# Patient Record
Sex: Female | Born: 1985 | Race: Black or African American | Hispanic: No | Marital: Single | Smoking: Former smoker
Health system: Southern US, Community
[De-identification: ages and names within clinical notes are randomized; demographics above are authoritative.]

## PROBLEM LIST (undated history)

## (undated) DIAGNOSIS — Z8669 Personal history of other diseases of the nervous system and sense organs: Secondary | ICD-10-CM

## (undated) DIAGNOSIS — M545 Low back pain, unspecified: Secondary | ICD-10-CM

## (undated) DIAGNOSIS — E079 Disorder of thyroid, unspecified: Secondary | ICD-10-CM

## (undated) DIAGNOSIS — J309 Allergic rhinitis, unspecified: Secondary | ICD-10-CM

## (undated) HISTORY — DX: Low back pain, unspecified: M54.50

## (undated) HISTORY — PX: HERNIA REPAIR: SHX51

## (undated) HISTORY — DX: Personal history of other diseases of the nervous system and sense organs: Z86.69

## (undated) HISTORY — DX: Allergic rhinitis, unspecified: J30.9

## (undated) HISTORY — DX: Low back pain: M54.5

---
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
## 2008-12-18 ENCOUNTER — Emergency Department (HOSPITAL_COMMUNITY): Admission: EM | Admit: 2008-12-18 | Discharge: 2008-12-18 | Payer: Self-pay | Admitting: Family Medicine

## 2010-01-23 ENCOUNTER — Emergency Department (HOSPITAL_COMMUNITY): Admission: EM | Admit: 2010-01-23 | Discharge: 2010-01-23 | Payer: Self-pay | Admitting: Emergency Medicine

## 2010-03-30 ENCOUNTER — Inpatient Hospital Stay (HOSPITAL_COMMUNITY)
Admission: AD | Admit: 2010-03-30 | Discharge: 2010-03-30 | Payer: Self-pay | Source: Home / Self Care | Attending: Obstetrics & Gynecology | Admitting: Obstetrics & Gynecology

## 2010-06-10 LAB — URINALYSIS, ROUTINE W REFLEX MICROSCOPIC
Bilirubin Urine: NEGATIVE
Glucose, UA: NEGATIVE mg/dL
Ketones, ur: NEGATIVE mg/dL
Nitrite: NEGATIVE
Specific Gravity, Urine: 1.02 (ref 1.005–1.030)

## 2010-06-10 LAB — WET PREP, GENITAL

## 2010-07-05 LAB — POCT PREGNANCY, URINE: Preg Test, Ur: NEGATIVE

## 2011-03-27 ENCOUNTER — Emergency Department (INDEPENDENT_AMBULATORY_CARE_PROVIDER_SITE_OTHER)
Admission: EM | Admit: 2011-03-27 | Discharge: 2011-03-27 | Disposition: A | Discharge status: Home / Self Care | Payer: BC Managed Care – PPO | Source: Home / Self Care | Attending: Emergency Medicine | Admitting: Emergency Medicine

## 2011-03-27 ENCOUNTER — Encounter: Payer: Self-pay | Admitting: Emergency Medicine

## 2011-03-27 DIAGNOSIS — J40 Bronchitis, not specified as acute or chronic: Secondary | ICD-10-CM

## 2011-03-27 MED ORDER — GUAIFENESIN-CODEINE 100-10 MG/5ML PO SYRP: 10.0000 mL | ORAL_SOLUTION | Freq: Four times a day (QID) | ORAL | Status: AC | PRN

## 2011-03-27 MED ORDER — DOXYCYCLINE HYCLATE 100 MG PO TABS: 100.0000 mg | ORAL_TABLET | Freq: Two times a day (BID) | ORAL | Status: AC

## 2011-03-27 NOTE — ED Provider Notes (Signed)
History     CSN: [id number]  Arrival date & time 03/27/11  1911   First MD Initiated Contact with Patient 03/27/11 1913      Chief Complaint  Patient presents with  . Sore Throat    (Consider location/radiation/quality/duration/timing/severity/associated sxs/prior treatment) HPI Comments: Mariah Rocha is a 25 year old female who has had a four-day history of myalgias, headache, sore throat, nasal congestion with a rhinorrhea and cough productive of yellow sputum. Her mother had the same symptoms. She has not gotten the flu vaccine this year. She denies fever, chills, nausea, vomiting, or diarrhea.  Patient is a 24 y.o. female presenting with pharyngitis.  Sore Throat Associated symptoms include headaches. Pertinent negatives include no abdominal pain and no shortness of breath.    History reviewed. No pertinent past medical history.  History reviewed. No pertinent past surgical history.  No family history on file.  History  Substance Use Topics  . Smoking status: Never Smoker   . Smokeless tobacco: Not on file  . Alcohol Use: No    OB History    Grav Para Term Preterm Abortions TAB SAB Ect Mult Living                  Review of Systems  Constitutional: Negative for fever, chills and fatigue.  HENT: Positive for congestion, sore throat and rhinorrhea. Negative for ear pain, sneezing, neck stiffness, voice change and postnasal drip.   Eyes: Negative for pain, discharge and redness.  Respiratory: Positive for cough. Negative for chest tightness, shortness of breath and wheezing.   Gastrointestinal: Negative for nausea, vomiting, abdominal pain and diarrhea.  Musculoskeletal: Positive for myalgias.  Skin: Negative for rash.  Neurological: Positive for headaches.    Allergies  Review of patient's allergies indicates no known allergies.  Home Medications   Current Outpatient Rx  Name Route Sig Dispense Refill  . SEASONIQUE PO Oral Take by mouth.      . DOXYCYCLINE  HYCLATE 100 MG PO TABS Oral Take 1 tablet (100 mg total) by mouth 2 (two) times daily. 20 tablet 0  . GUAIFENESIN-CODEINE 100-10 MG/5ML PO SYRP Oral Take 10 mLs by mouth 4 (four) times daily as needed for cough. 120 mL 0    BP 133/78  Pulse 81  Temp(Src) 98.5 F (36.9 C) (Oral)  Resp 16  SpO2 100%  LMP 01/30/2011  Physical Exam  Nursing note and vitals reviewed. Constitutional: She appears well-developed and well-nourished. No distress.  HENT:  Head: Normocephalic and atraumatic.  Right Ear: External ear normal.  Left Ear: External ear normal.  Nose: Nose normal.  Mouth/Throat: Oropharynx is clear and moist. No oropharyngeal exudate.  Eyes: Conjunctivae and EOM are normal. Pupils are equal, round, and reactive to light. Right eye exhibits no discharge. Left eye exhibits no discharge.  Neck: Normal range of motion. Neck supple.  Cardiovascular: Normal rate, regular rhythm and normal heart sounds.   Pulmonary/Chest: Effort normal and breath sounds normal. No stridor. No respiratory distress. She has no wheezes. She has no rales. She exhibits no tenderness.  Lymphadenopathy:    She has no cervical adenopathy.  Skin: Skin is warm and dry. No rash noted. She is not diaphoretic.    ED Course  Procedures (including critical care time)  Labs Reviewed - No data to display No results found.   1. Bronchitis       MDM  She has bronchitis. Will treat with doxycycline and guaifenesin/codeine cough syrup.        Onalee Hua  Matthew Folks, MD 03/27/11 2120

## 2011-03-27 NOTE — ED Notes (Signed)
Triage assess. Lengthened due to two patient's in same room

## 2011-03-27 NOTE — ED Notes (Signed)
Sore throat cough, body aches, headache

## 2011-04-14 ENCOUNTER — Emergency Department (INDEPENDENT_AMBULATORY_CARE_PROVIDER_SITE_OTHER)
Admission: EM | Admit: 2011-04-14 | Discharge: 2011-04-14 | Disposition: A | Discharge status: Home / Self Care | Payer: BC Managed Care – PPO | Source: Home / Self Care | Attending: Emergency Medicine | Admitting: Emergency Medicine

## 2011-04-14 ENCOUNTER — Encounter (HOSPITAL_COMMUNITY): Payer: Self-pay

## 2011-04-14 DIAGNOSIS — H109 Unspecified conjunctivitis: Secondary | ICD-10-CM

## 2011-04-14 MED ORDER — TOBRAMYCIN 0.3 % OP SOLN: 1.0000 [drp] | OPHTHALMIC | Status: AC

## 2011-04-14 NOTE — ED Provider Notes (Signed)
History     CSN: [id number]  Arrival date & time 04/14/11  1552   First MD Initiated Contact with Patient 04/14/11 1702      Chief Complaint  Patient presents with  . Eye Problem    (Consider location/radiation/quality/duration/timing/severity/associated sxs/prior treatment) HPI Comments: WOKE UP THIS MORNING WITH  "CRUSTED DISCHARGE AND REDNESS OF MY LEFT EYE" "IT FEELS DIFFERENT, BUT NO PAIN" "NO TRAUMA"   Patient is a 26 y.o. female presenting with eye problem. The history is provided by the patient.  Eye Problem  This is a new problem. The current episode started yesterday. The problem occurs constantly. There is pain in the left eye. The pain is at a severity of 2/10. The pain is mild. There is no history of trauma to the eye. There is no known exposure to pink eye. She does not wear contacts. Associated symptoms include discharge and eye redness. Pertinent negatives include no numbness, no blurred vision, no decreased vision, no foreign body sensation, no photophobia, no nausea, no vomiting, no tingling, no weakness and no itching. She has tried nothing for the symptoms.    History reviewed. No pertinent past medical history.  History reviewed. No pertinent past surgical history.  History reviewed. No pertinent family history.  History  Substance Use Topics  . Smoking status: Never Smoker   . Smokeless tobacco: Not on file  . Alcohol Use: No    OB History    Grav Para Term Preterm Abortions TAB SAB Ect Mult Living                  Review of Systems  Constitutional: Negative for fever, fatigue and unexpected weight change.  HENT: Negative for ear pain, facial swelling, neck pain and neck stiffness.   Eyes: Positive for discharge and redness. Negative for blurred vision and photophobia.  Respiratory: Negative for wheezing.   Gastrointestinal: Negative for nausea and vomiting.  Skin: Negative for itching and rash.  Neurological: Negative for dizziness, tingling,  seizures, weakness, numbness and headaches.    Allergies  Review of patient's allergies indicates no known allergies.  Home Medications   Current Outpatient Rx  Name Route Sig Dispense Refill  . SEASONIQUE PO Oral Take by mouth.      . TOBRAMYCIN SULFATE 0.3 % OP SOLN Left Eye Place 1 drop into the left eye every 4 (four) hours. 5 mL 0    BP 118/55  Pulse 84  Temp(Src) 99.2 F (37.3 C) (Oral)  Resp 16  SpO2 99%  LMP 01/30/2011  Physical Exam  Nursing note and vitals reviewed. Constitutional: She appears well-developed. No distress.  HENT:  Head: Normocephalic.  Mouth/Throat: No oropharyngeal exudate.  Eyes: EOM are normal. Pupils are equal, round, and reactive to light. Right eye exhibits discharge. Right eye exhibits no exudate. Left conjunctiva is injected. No scleral icterus. Right pupil is reactive. Left pupil is reactive.  Neck: Normal range of motion.  Cardiovascular: Normal rate.   No murmur heard. Pulmonary/Chest: Effort normal. No respiratory distress. She has no decreased breath sounds. She has no wheezes.  Abdominal: Soft. There is no tenderness.  Lymphadenopathy:    She has no cervical adenopathy.  Skin: No erythema.    ED Course  Procedures (including critical care time)  Labs Reviewed - No data to display No results found.   1. Conjunctivitis       MDM  Conjunctivitis OS        Jimmie Molly, MD 04/14/11 1836

## 2011-04-14 NOTE — ED Notes (Signed)
I think I have pink eye ; woke w left eye reddened, some mild pain

## 2012-06-09 ENCOUNTER — Encounter (HOSPITAL_COMMUNITY): Payer: Self-pay

## 2012-06-09 ENCOUNTER — Emergency Department (INDEPENDENT_AMBULATORY_CARE_PROVIDER_SITE_OTHER)
Admission: EM | Admit: 2012-06-09 | Discharge: 2012-06-09 | Disposition: A | Discharge status: Home / Self Care | Payer: BC Managed Care – PPO | Source: Home / Self Care | Attending: Emergency Medicine | Admitting: Emergency Medicine

## 2012-06-09 DIAGNOSIS — A088 Other specified intestinal infections: Secondary | ICD-10-CM

## 2012-06-09 MED ORDER — ONDANSETRON 8 MG PO TBDP: 8.0000 mg | ORAL_TABLET | Freq: Three times a day (TID) | ORAL | Status: DC | PRN

## 2012-06-09 NOTE — ED Provider Notes (Signed)
Chief Complaint  Patient presents with  . GI Problem    History of Present Illness:   Mariah Rocha is a 27 year old female who has had a three-day history of nausea, vomiting, and diarrhea after eating a meal at a [employer]. She's had sweats abdominal pain but no fever or chills. She denies any blood in the vomitus or the stool. She's not been exposed to anyone with a similar illness.  Review of Systems:  Other than noted above, the patient denies any of the following symptoms: Systemic:  No fevers, chills, sweats, weight loss or gain, fatigue, or tiredness. ENT:  No nasal congestion, rhinorrhea, or sore throat. Lungs:  No cough, wheezing, or shortness of breath. Cardiac:  No chest pain, syncope, or presyncope. GI:  No abdominal pain, nausea, vomiting, anorexia, diarrhea, constipation, blood in stool or vomitus. GU:  No dysuria, frequency, or urgency.  PMFSH:  Past medical history, family history, social history, meds, and allergies were reviewed.  Physical Exam:   Vital signs:  BP 127/63  Pulse 98  Temp(Src) 98.3 F (36.8 C) (Oral)  Resp 16  SpO2 100% General:  Alert and oriented.  In no distress.  Skin warm and dry.  Good skin turgor, brisk capillary refill. ENT:  No scleral icterus, moist mucous membranes, no oral lesions, pharynx clear. Lungs:  Breath sounds clear and equal bilaterally.  No wheezes, rales, or rhonchi. Heart:  Rhythm regular, without extrasystoles.  No gallops or murmers. Abdomen:  Mild, generalized tenderness to palpation without localized tenderness to palpation, guarding, or rebound. Bowel sounds are hyperactive. No organomegaly or mass. Skin: Clear, warm, and dry.  Good turgor.  Brisk capillary refill.  Assessment:  The encounter diagnosis was Viral gastroenteritis.  Plan:   1.  The following meds were prescribed:   Discharge Medication List as of 06/09/2012 12:30 PM    START taking these medications   Details  ondansetron (ZOFRAN ODT) 8 MG  disintegrating tablet Take 1 tablet (8 mg total) by mouth every 8 (eight) hours as needed for nausea., Starting 06/09/2012, Until Discontinued, Normal       2.  The patient was instructed in symptomatic care and handouts were given. 3.  The patient was told to return if becoming worse in any way, if no better in 2 or 3 days, and given some red flag symptoms that would indicate earlier return. 4.  The patient was told to take only sips of clear liquids for the next 24 hours and then advance to a b.r.a.t. Diet.      Reuben Likes, MD 06/09/12 2204

## 2012-06-09 NOTE — ED Notes (Signed)
Report she ate fast food earlier this week, and next day had n/v/d, abd cramps

## 2012-12-28 ENCOUNTER — Encounter: Payer: Self-pay | Admitting: Internal Medicine

## 2013-01-07 ENCOUNTER — Encounter: Payer: Self-pay | Admitting: Internal Medicine

## 2013-01-12 ENCOUNTER — Ambulatory Visit
Admission: RE | Admit: 2013-01-12 | Discharge: 2013-01-12 | Disposition: A | Discharge status: Home / Self Care | Payer: BC Managed Care – PPO | Source: Ambulatory Visit | Attending: Internal Medicine | Admitting: Internal Medicine

## 2013-01-12 ENCOUNTER — Encounter: Payer: Self-pay | Admitting: Internal Medicine

## 2013-01-12 ENCOUNTER — Ambulatory Visit (INDEPENDENT_AMBULATORY_CARE_PROVIDER_SITE_OTHER): Payer: BC Managed Care – PPO | Admitting: Internal Medicine

## 2013-01-12 VITALS — BP 120/76 | HR 90 | Temp 98.2°F | Resp 18 | Ht 66.5 in | Wt 161.8 lb

## 2013-01-12 DIAGNOSIS — L74519 Primary focal hyperhidrosis, unspecified: Secondary | ICD-10-CM

## 2013-01-12 DIAGNOSIS — M545 Low back pain, unspecified: Secondary | ICD-10-CM

## 2013-01-12 DIAGNOSIS — R5381 Other malaise: Secondary | ICD-10-CM | POA: Insufficient documentation

## 2013-01-12 DIAGNOSIS — Z23 Encounter for immunization: Secondary | ICD-10-CM

## 2013-01-12 DIAGNOSIS — Z0000 Encounter for general adult medical examination without abnormal findings: Secondary | ICD-10-CM

## 2013-01-12 DIAGNOSIS — G43909 Migraine, unspecified, not intractable, without status migrainosus: Secondary | ICD-10-CM

## 2013-01-12 DIAGNOSIS — J309 Allergic rhinitis, unspecified: Secondary | ICD-10-CM

## 2013-01-12 MED ORDER — TETANUS-DIPHTHERIA TOXOIDS TD 2-2 LF/0.5ML IM SUSP: 0.5000 mL | Freq: Once | INTRAMUSCULAR | Status: DC

## 2013-01-12 MED ORDER — SUMATRIPTAN SUCCINATE 25 MG PO TABS: 25.0000 mg | ORAL_TABLET | Freq: Every day | ORAL | Status: DC

## 2013-01-12 MED ORDER — PROPRANOLOL HCL 40 MG PO TABS: 40.0000 mg | ORAL_TABLET | Freq: Two times a day (BID) | ORAL | Status: DC
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
## 2013-01-12 NOTE — Patient Instructions (Signed)
Maintain a headache diary Do not stop medication on your own Maintain correct posture at work as discussed in the office

## 2013-01-13 LAB — COMPREHENSIVE METABOLIC PANEL
ALT: 16 IU/L (ref 0–32)
AST: 17 IU/L (ref 0–40)
Albumin/Globulin Ratio: 1.5 (ref 1.1–2.5)
Albumin: 4.4 g/dL (ref 3.5–5.5)
Alkaline Phosphatase: 34 IU/L — ABNORMAL LOW (ref 39–117)
BUN/Creatinine Ratio: 18 (ref 8–20)
BUN: 13 mg/dL (ref 6–20)
CO2: 20 mmol/L (ref 18–29)
Calcium: 9.9 mg/dL (ref 8.7–10.2)
Chloride: 100 mmol/L (ref 97–108)
Creatinine, Ser: 0.72 mg/dL (ref 0.57–1.00)
GFR calc Af Amer: 135 mL/min/{1.73_m2} (ref >59)
GFR calc non Af Amer: 117 mL/min/{1.73_m2} (ref >59)
Globulin, Total: 2.9 g/dL (ref 1.5–4.5)
Glucose: 80 mg/dL (ref 65–99)
Potassium: 4.6 mmol/L (ref 3.5–5.2)
Sodium: 139 mmol/L (ref 134–144)
Total Bilirubin: 0.6 mg/dL (ref 0.0–1.2)
Total Protein: 7.3 g/dL (ref 6.0–8.5)

## 2013-01-13 LAB — CBC WITH DIFFERENTIAL/PLATELET
Basos: 0 %
Eos: 2 %
Eosinophils Absolute: 0.1 10*3/uL (ref 0.0–0.4)
HCT: 37.7 % (ref 34.0–46.6)
Immature Grans (Abs): 0 10*3/uL (ref 0.0–0.1)
Lymphocytes Absolute: 1.8 10*3/uL (ref 0.7–3.1)
MCH: 30.9 pg (ref 26.6–33.0)
MCHC: 34.2 g/dL (ref 31.5–35.7)
Monocytes Absolute: 0.5 10*3/uL (ref 0.1–0.9)
Neutrophils Relative %: 63 %
WBC: 6.5 10*3/uL (ref 3.4–10.8)

## 2013-01-13 LAB — LIPID PANEL
Chol/HDL Ratio: 2.9 ratio units (ref 0.0–4.4)
HDL: 74 mg/dL (ref >39)
Triglycerides: 106 mg/dL (ref 0–149)
VLDL Cholesterol Cal: 21 mg/dL (ref 5–40)

## 2013-02-09 ENCOUNTER — Ambulatory Visit (INDEPENDENT_AMBULATORY_CARE_PROVIDER_SITE_OTHER): Payer: BC Managed Care – PPO | Admitting: Internal Medicine

## 2013-02-09 ENCOUNTER — Encounter: Payer: Self-pay | Admitting: Internal Medicine

## 2013-02-09 VITALS — BP 132/70 | HR 80 | Temp 98.3°F | Resp 16 | Wt 165.2 lb

## 2013-02-09 DIAGNOSIS — M545 Low back pain: Secondary | ICD-10-CM

## 2013-02-09 DIAGNOSIS — E785 Hyperlipidemia, unspecified: Secondary | ICD-10-CM

## 2013-02-09 DIAGNOSIS — Z23 Encounter for immunization: Secondary | ICD-10-CM

## 2013-02-09 DIAGNOSIS — R51 Headache: Secondary | ICD-10-CM
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
## 2013-04-14 ENCOUNTER — Encounter (HOSPITAL_COMMUNITY): Payer: Self-pay | Admitting: Emergency Medicine

## 2013-04-14 ENCOUNTER — Emergency Department (INDEPENDENT_AMBULATORY_CARE_PROVIDER_SITE_OTHER)
Admission: EM | Admit: 2013-04-14 | Discharge: 2013-04-14 | Disposition: A | Discharge status: Home / Self Care | Payer: BC Managed Care – PPO | Source: Home / Self Care

## 2013-04-14 DIAGNOSIS — J069 Acute upper respiratory infection, unspecified: Secondary | ICD-10-CM

## 2013-04-14 DIAGNOSIS — J329 Chronic sinusitis, unspecified: Secondary | ICD-10-CM

## 2013-04-14 NOTE — ED Notes (Signed)
Pt  Reports  Symptoms  Of  Cough   Congested        Symptoms  Of  Body  Aches  And  sorethroat          And  Congestion  With the  symptos  X  3  Days  -  She  Is  Sitting upright on the  Exam table  In no acute  Distress  Speaking in  Complete  sentances

## 2013-04-14 NOTE — Discharge Instructions (Signed)
Upper Respiratory Infection, Adult Ibuprofen 600 mg Alka Seltzer Cold Plus Nighttime medications Pharyngitis Pharyngitis is redness, pain, and swelling (inflammation) of your pharynx.  CAUSES  Pharyngitis is usually caused by infection. Most of the time, these infections are from viruses (viral) and are part of a cold. However, sometimes pharyngitis is caused by bacteria (bacterial). Pharyngitis can also be caused by allergies. Viral pharyngitis may be spread from person to person by coughing, sneezing, and personal items or utensils (cups, forks, spoons, toothbrushes). Bacterial pharyngitis may be spread from person to person by more intimate contact, such as kissing.  SIGNS AND SYMPTOMS  Symptoms of pharyngitis include:   Sore throat.   Tiredness (fatigue).   Low-grade fever.   Headache.  Joint pain and muscle aches.  Skin rashes.  Swollen lymph nodes.  Plaque-like film on throat or tonsils (often seen with bacterial pharyngitis). DIAGNOSIS  Your health care provider will ask you questions about your illness and your symptoms. Your medical history, along with a physical exam, is often all that is needed to diagnose pharyngitis. Sometimes, a rapid strep test is done. Other lab tests may also be done, depending on the suspected cause.  TREATMENT  Viral pharyngitis will usually get better in 3 4 days without the use of medicine. Bacterial pharyngitis is treated with medicines that kill germs (antibiotics).  HOME CARE INSTRUCTIONS   Drink enough water and fluids to keep your urine clear or pale yellow.   Only take over-the-counter or prescription medicines as directed by your health care provider:   If you are prescribed antibiotics, make sure you finish them even if you start to feel better.   Do not take aspirin.   Get lots of rest.   Gargle with 8 oz of salt water ( tsp of salt per 1 qt of water) as often as every 1 2 hours to soothe your throat.   Throat  lozenges (if you are not at risk for choking) or sprays may be used to soothe your throat. SEEK MEDICAL CARE IF:   You have large, tender lumps in your neck.  You have a rash.  You cough up green, yellow-brown, or bloody spit. SEEK IMMEDIATE MEDICAL CARE IF:   Your neck becomes stiff.  You drool or are unable to swallow liquids.  You vomit or are unable to keep medicines or liquids down.  You have severe pain that does not go away with the use of recommended medicines.  You have trouble breathing (not caused by a stuffy nose). MAKE SURE YOU:   Understand these instructions.  Will watch your condition.  Will get help right away if you are not doing well or get worse. Document Released: 03/17/2005 Document Revised: 01/05/2013 Document Reviewed: 11/22/2012 [facility] Patient Information 2014 [place], [place].  An upper respiratory infection (URI) is also sometimes known as the common cold. The upper respiratory tract includes the nose, sinuses, throat, trachea, and bronchi. Bronchi are the airways leading to the lungs. Most people improve within 1 week, but symptoms can last up to 2 weeks. A residual cough may last even longer.  CAUSES Many different viruses can infect the tissues lining the upper respiratory tract. The tissues become irritated and inflamed and often become very moist. Mucus production is also common. A cold is contagious. You can easily spread the virus to others by oral contact. This includes kissing, sharing a glass, coughing, or sneezing. Touching your mouth or nose and then touching a surface, which is then touched by  another person, can also spread the virus. SYMPTOMS  Symptoms typically develop 1 to 3 days after you come in contact with a cold virus. Symptoms vary from person to person. They may include:  Runny nose.  Sneezing.  Nasal congestion.  Sinus irritation.  Sore throat.  Loss of voice (laryngitis).  Cough.  Fatigue.  Muscle aches.  Loss  of appetite.  Headache.  Low-grade fever. DIAGNOSIS  You might diagnose your own cold based on familiar symptoms, since most people get a cold 2 to 3 times a year. Your caregiver can confirm this based on your exam. Most importantly, your caregiver can check that your symptoms are not due to another disease such as strep throat, sinusitis, pneumonia, asthma, or epiglottitis. Blood tests, throat tests, and X-rays are not necessary to diagnose a common cold, but they may sometimes be helpful in excluding other more serious diseases. Your caregiver will decide if any further tests are required. RISKS AND COMPLICATIONS  You may be at risk for a more severe case of the common cold if you smoke cigarettes, have chronic heart disease (such as heart failure) or lung disease (such as asthma), or if you have a weakened immune system. The very young and very old are also at risk for more serious infections. Bacterial sinusitis, middle ear infections, and bacterial pneumonia can complicate the common cold. The common cold can worsen asthma and chronic obstructive pulmonary disease (COPD). Sometimes, these complications can require emergency medical care and may be life-threatening. PREVENTION  The best way to protect against getting a cold is to practice good hygiene. Avoid oral or hand contact with people with cold symptoms. Wash your hands often if contact occurs. There is no clear evidence that vitamin C, vitamin E, echinacea, or exercise reduces the chance of developing a cold. However, it is always recommended to get plenty of rest and practice good nutrition. TREATMENT  Treatment is directed at relieving symptoms. There is no cure. Antibiotics are not effective, because the infection is caused by a virus, not by bacteria. Treatment may include:  Increased fluid intake. Sports drinks offer valuable electrolytes, sugars, and fluids.  Breathing heated mist or steam (vaporizer or shower).  Eating chicken  soup or other clear broths, and maintaining good nutrition.  Getting plenty of rest.  Using gargles or lozenges for comfort.  Controlling fevers with ibuprofen or acetaminophen as directed by your caregiver.  Increasing usage of your inhaler if you have asthma. Zinc gel and zinc lozenges, taken in the first 24 hours of the common cold, can shorten the duration and lessen the severity of symptoms. Pain medicines may help with fever, muscle aches, and throat pain. A variety of non-prescription medicines are available to treat congestion and runny nose. Your caregiver can make recommendations and may suggest nasal or lung inhalers for other symptoms.  HOME CARE INSTRUCTIONS   Only take over-the-counter or prescription medicines for pain, discomfort, or fever as directed by your caregiver.  Use a warm mist humidifier or inhale steam from a shower to increase air moisture. This may keep secretions moist and make it easier to breathe.  Drink enough water and fluids to keep your urine clear or pale yellow.  Rest as needed.  Return to work when your temperature has returned to normal or as your caregiver advises. You may need to stay home longer to avoid infecting others. You can also use a face mask and careful hand washing to prevent spread of the virus. SEEK MEDICAL  CARE IF:   After the first few days, you feel you are getting worse rather than better.  You need your caregiver's advice about medicines to control symptoms.  You develop chills, worsening shortness of breath, or brown or red sputum. These may be signs of pneumonia.  You develop yellow or brown nasal discharge or pain in the face, especially when you bend forward. These may be signs of sinusitis.  You develop a fever, swollen neck glands, pain with swallowing, or white areas in the back of your throat. These may be signs of strep throat. SEEK IMMEDIATE MEDICAL CARE IF:   You have a fever.  You develop severe or persistent  headache, ear pain, sinus pain, or chest pain.  You develop wheezing, a prolonged cough, cough up blood, or have a change in your usual mucus (if you have chronic lung disease).  You develop sore muscles or a stiff neck. Document Released: 09/10/2000 Document Revised: 06/09/2011 Document Reviewed: 07/19/2010 [facility] Patient Information 2014 [place].  Sinusitis Sinusitis is redness, soreness, and puffiness (inflammation) of the air pockets in the bones of your face (sinuses). The redness, soreness, and puffiness can cause air and mucus to get trapped in your sinuses. This can allow germs to grow and cause an infection.  HOME CARE   Drink enough fluids to keep your pee (urine) clear or pale yellow.  Use a humidifier in your home.  Run a hot shower to create steam in the bathroom. Sit in the bathroom with the door closed. Breathe in the steam 3 4 times a day.  Put a warm, moist washcloth on your face 3 4 times a day, or as told by your doctor.  Use salt water sprays (saline sprays) to wet the thick fluid in your nose. This can help the sinuses drain.  Only take medicine as told by your doctor. GET HELP RIGHT AWAY IF:   Your pain gets worse.  You have very bad headaches.  You are sick to your stomach (nauseous).  You throw up (vomit).  You are very sleepy (drowsy) all the time.  Your face is puffy (swollen).  Your vision changes.  You have a stiff neck.  You have trouble breathing. MAKE SURE YOU:   Understand these instructions.  Will watch your condition.  Will get help right away if you are not doing well or get worse. Document Released: 09/03/2007 Document Revised: 12/10/2011 Document Reviewed: 10/21/2011 [facility] Patient Information 2014 [place], [place].

## 2013-04-14 NOTE — ED Provider Notes (Signed)
CSN: [id number]     Arrival date & time 04/14/13  1008 History   None    Chief Complaint  Patient presents with  . URI   (Consider location/radiation/quality/duration/timing/severity/associated sxs/prior Treatment) HPI Comments: Complains of cold symptoms for 3 days.   Past Medical History  Diagnosis Date  . Allergic rhinitis   . Lower back pain    Past Surgical History  Procedure Laterality Date  . Hernia repair  1989     Dr.Kathleen Samuel BoucheLucas    Family History  Problem Relation Age of Onset  . Breast cancer Maternal Aunt   . Hypertension Mother    History  Substance Use Topics  . Smoking status: Never Smoker   . Smokeless tobacco: Not on file  . Alcohol Use: Yes     Comment: once a week   OB History   Grav Para Term Preterm Abortions TAB SAB Ect Mult Living                 Review of Systems  Constitutional: Positive for activity change and fatigue. Negative for fever, chills and appetite change.  HENT: Positive for congestion, postnasal drip, rhinorrhea and sore throat. Negative for facial swelling.   Eyes: Negative.  Negative for visual disturbance.  Respiratory: Positive for cough. Negative for wheezing and stridor.   Cardiovascular: Negative.   Gastrointestinal: Positive for nausea. Negative for vomiting, abdominal pain, constipation and blood in stool.  Musculoskeletal: Negative for neck pain and neck stiffness.  Skin: Negative for pallor and rash.  Neurological: Negative.     Allergies  Review of patient's allergies indicates no known allergies.  Home Medications   Current Outpatient Rx  Name  Route  Sig  Dispense  Refill  . Levonorgest-Eth Estrad 91-Day (SEASONIQUE PO)   Oral   Take by mouth.            BP 144/87  Pulse 92  Temp(Src) 98.9 F (37.2 C) (Oral)  Resp 16  SpO2 98% Physical Exam  Nursing note and vitals reviewed. Constitutional: She is oriented to person, place, and time. She appears well-developed and well-nourished. No distress.   HENT:  Mouth/Throat: No oropharyngeal exudate.  Bilateral TMs are normal Oropharynx with cobblestoning, minor erythema and clear PND.  Eyes: Conjunctivae and EOM are normal.  Neck: Normal range of motion. Neck supple.  Cardiovascular: Normal rate, regular rhythm and normal heart sounds.   Pulmonary/Chest: Effort normal and breath sounds normal. No respiratory distress. She has no wheezes.  Abdominal: Soft. There is no tenderness.  Musculoskeletal: Normal range of motion. She exhibits no edema.  Lymphadenopathy:    She has no cervical adenopathy.  Neurological: She is alert and oriented to person, place, and time.  Skin: Skin is warm and dry. No rash noted.  Psychiatric: She has a normal mood and affect.    ED Course  Procedures (including critical care time) Labs Review Labs Reviewed - No data to display Imaging Review No results found.    MDM   1. URI (upper respiratory infection)   2. Rhinosinusitis      Alka seltzer plus cold nighttime  Many fluids Cepacol lozenges and Chloraseptic spray for throat Ibuprofen 600 mg Q6 to 8 hours when necessary   Hayden Rasmussenavid Kamarri Fischetti, NP 04/14/13 1109  Hayden Rasmussenavid Tesslyn Baumert, NP 04/14/13 1654

## 2013-04-15 NOTE — ED Provider Notes (Signed)
Medical screening examination/treatment/procedure(s) were performed by a resident physician or non-physician practitioner and as the supervising physician I was immediately available for consultation/collaboration.  Sears Oran, MD    Camron Monday S Keyaria Lawson, MD 04/15/13 0753 

## 2013-04-19 ENCOUNTER — Encounter: Payer: Self-pay | Admitting: Internal Medicine

## 2013-04-19 ENCOUNTER — Ambulatory Visit (INDEPENDENT_AMBULATORY_CARE_PROVIDER_SITE_OTHER): Payer: BC Managed Care – PPO | Admitting: Internal Medicine

## 2013-04-19 VITALS — BP 140/78 | HR 114 | Temp 98.0°F | Resp 16 | Wt 167.6 lb

## 2013-04-19 DIAGNOSIS — G43909 Migraine, unspecified, not intractable, without status migrainosus: Secondary | ICD-10-CM

## 2013-04-19 DIAGNOSIS — R05 Cough: Secondary | ICD-10-CM

## 2013-04-19 DIAGNOSIS — R519 Headache, unspecified: Secondary | ICD-10-CM

## 2013-04-19 DIAGNOSIS — R51 Headache: Secondary | ICD-10-CM

## 2013-04-19 DIAGNOSIS — R059 Cough, unspecified: Secondary | ICD-10-CM

## 2013-04-19 MED ORDER — SUMATRIPTAN SUCCINATE 50 MG PO TABS: ORAL_TABLET | ORAL | Status: DC

## 2013-04-19 NOTE — Progress Notes (Signed)
Patient ID: Mariah Rocha, female   DOB: 11/19/1985, 28 y.o.   MRN: [id number]    Chief Complaint  Patient presents with  . Acute Visit    headache, cough   No Known Allergies  HPI 28 y/o female pt seen today for AV. She has been having headache on a daily basis for a month now. She had headache before and it had improved after getting prescription glasses. She has now started having it again. It is in the frontal area, goes to the temples and then sometimes to the back of her head. No radiation of the pain. No lacrimation or nasal drainage.  No nausea or vomiting with this No focal weakness or neurological symptoms No change in vision No aura Also had URI a week back. Her cough persists and it is dry with itching at back of her throat. Other symptoms have resolved. No sore throat, fever or chills  ROS See hpi Rest are negative  Past Medical History  Diagnosis Date  . Allergic rhinitis   . Lower back pain    Past Surgical History  Procedure Laterality Date  . Hernia repair  1989     Dr.Kathleen Linton Rump    Current Outpatient Prescriptions on File Prior to Visit  Medication Sig Dispense Refill  . Levonorgest-Eth Estrad 91-Day (SEASONIQUE PO) Take by mouth.         No current facility-administered medications on file prior to visit.    Physical exam BP 140/78  Pulse 114  Temp(Src) 98 F (36.7 C) (Oral)  Resp 16  Wt 167 lb 9.6 oz (76.023 kg)  SpO2 98%  Constitutional: She is oriented to person, place, and time. She appears well-developed and well-nourished. No distress.   HENT:   Head: Normocephalic and atraumatic. Temporal tenderness on exam Eyes: Conjunctivae and EOM are normal. Pupils are equal, round, and reactive to light. Right eye exhibits no discharge. Left eye exhibits no discharge. No scleral icterus.   Neck: Normal range of motion. Neck supple. No JVD present. No tracheal deviation present. No thyromegaly present.   Cardiovascular: Normal rate, regular  rhythm, normal heart sounds and intact distal pulses.  Exam reveals no friction rub.    No murmur heard. Pulmonary/Chest: Effort normal and breath sounds normal. No stridor. No respiratory distress. She has no wheezes. She has no rales. She exhibits no tenderness.   Abdominal: Soft. Bowel sounds are normal. She exhibits no distension and no mass. There is no tenderness. There is no rebound and no guarding.  Musculoskeletal: Normal range of motion. She exhibits no edema and no tenderness.  Lymphadenopathy:    She has no cervical adenopathy.  Neurological: She is alert and oriented to person, place, and time. She has normal reflexes. She displays normal reflexes. No cranial nerve deficit. She exhibits normal muscle tone. Coordination normal.   Skin: Skin is warm and dry. No rash noted. She is not diaphoretic. No erythema. No pallor.  Has birth mark in left upper back and tattoo on right upper back  Psychiatric: She has a normal mood and affect. Her behavior is normal. Judgment and thought content normal.   Labs- CBC    Component Value Date/Time   WBC 6.5 01/12/2013 0953   RBC 4.18 01/12/2013 0953   HGB 12.9 01/12/2013 0953   HCT 37.7 01/12/2013 0953   MCV 90 01/12/2013 0953   MCH 30.9 01/12/2013 0953   MCHC 34.2 01/12/2013 0953   RDW 13.8 01/12/2013 0953   LYMPHSABS 1.8 01/12/2013 0953  EOSABS 0.1 01/12/2013 0953   BASOSABS 0.0 01/12/2013 0953    CMP     Component Value Date/Time   NA 139 01/12/2013 0953   K 4.6 01/12/2013 0953   CL 100 01/12/2013 0953   CO2 20 01/12/2013 0953   GLUCOSE 80 01/12/2013 0953   BUN 13 01/12/2013 0953   CREATININE 0.72 01/12/2013 0953   CALCIUM 9.9 01/12/2013 0953   PROT 7.3 01/12/2013 0953   AST 17 01/12/2013 0953   ALT 16 01/12/2013 0953   ALKPHOS 34* 01/12/2013 0953   BILITOT 0.6 01/12/2013 0953   GFRNONAA 117 01/12/2013 0953   GFRAA 135 01/12/2013 0953   Lipid Panel     Component Value Date/Time   TRIG 106 01/12/2013 0953   HDL 74  01/12/2013 0953   CHOLHDL 2.9 01/12/2013 0953   LDLCALC 120* 01/12/2013 0953    Assessment/plan  Cough- from her URI. Advised to try OTC chestal three times a day to see if this would help with her cough  Temporal headache- likely from her migraine type headache. But given her young age, will rule out temporal arteritis. Check esr  Migraine- will have her on sumitriptan 50 mg daily as needed for headache with additional 50 mg in 2 hrs if no improvement. Reassess if no improvement. Emergency signs with headache explained and pt understands to seek help if occurs

## 2013-04-20 LAB — SEDIMENTATION RATE: SED RATE: 14 mm/h (ref 0–32)

## 2013-10-07 ENCOUNTER — Telehealth: Payer: Self-pay | Admitting: *Deleted

## 2013-10-07 ENCOUNTER — Other Ambulatory Visit: Payer: Self-pay | Admitting: Internal Medicine

## 2013-10-07 MED ORDER — SCOPOLAMINE 1 MG/3DAYS TD PT72: 1.0000 | MEDICATED_PATCH | TRANSDERMAL | Status: DC

## 2013-10-07 NOTE — Telephone Encounter (Signed)
Patient called and stated that she is going on a 8 day cruise next week and would like to know if you would call her in a Motion Sickness Patch. Please Advise  [address]

## 2013-10-10 NOTE — Telephone Encounter (Signed)
Dr. Glade Lloydpandey faxed medication to pharmacy and Chrae informed patient.

## 2014-02-10 ENCOUNTER — Other Ambulatory Visit: Payer: BC Managed Care – PPO

## 2014-02-10 DIAGNOSIS — E785 Hyperlipidemia, unspecified: Secondary | ICD-10-CM

## 2014-02-10 DIAGNOSIS — R5382 Chronic fatigue, unspecified: Secondary | ICD-10-CM

## 2014-02-11 LAB — CBC
HCT: 38.2 % (ref 34.0–46.6)
HEMOGLOBIN: 12.6 g/dL (ref 11.1–15.9)
MCH: 29.2 pg (ref 26.6–33.0)
MCHC: 33 g/dL (ref 31.5–35.7)
MCV: 88 fL (ref 79–97)
Platelets: 343 10*3/uL (ref 150–379)
RBC: 4.32 x10E6/uL (ref 3.77–5.28)
RDW: 13.4 % (ref 12.3–15.4)
WBC: 5.5 10*3/uL (ref 3.4–10.8)

## 2014-02-11 LAB — COMPREHENSIVE METABOLIC PANEL
ALK PHOS: 40 IU/L (ref 39–117)
ALT: 13 IU/L (ref 0–32)
AST: 14 IU/L (ref 0–40)
Albumin/Globulin Ratio: 1.2 (ref 1.1–2.5)
Albumin: 3.8 g/dL (ref 3.5–5.5)
BUN / CREAT RATIO: 10 (ref 8–20)
BUN: 7 mg/dL (ref 6–20)
CALCIUM: 9.2 mg/dL (ref 8.7–10.2)
CO2: 21 mmol/L (ref 18–29)
CREATININE: 0.71 mg/dL (ref 0.57–1.00)
Chloride: 103 mmol/L (ref 97–108)
GFR, EST AFRICAN AMERICAN: 136 mL/min/{1.73_m2} (ref >59)
GFR, EST NON AFRICAN AMERICAN: 118 mL/min/{1.73_m2} (ref >59)
GLUCOSE: 90 mg/dL (ref 65–99)
Globulin, Total: 3.1 g/dL (ref 1.5–4.5)
Potassium: 4.5 mmol/L (ref 3.5–5.2)
SODIUM: 138 mmol/L (ref 134–144)
TOTAL PROTEIN: 6.9 g/dL (ref 6.0–8.5)
Total Bilirubin: 0.4 mg/dL (ref 0.0–1.2)

## 2014-02-11 LAB — LIPID PANEL
CHOL/HDL RATIO: 3.4 ratio (ref 0.0–4.4)
Cholesterol, Total: 178 mg/dL (ref 100–199)
HDL: 52 mg/dL (ref >39)
LDL CALC: 102 mg/dL — AB (ref 0–99)
Triglycerides: 121 mg/dL (ref 0–149)
VLDL CHOLESTEROL CAL: 24 mg/dL (ref 5–40)

## 2014-02-14 ENCOUNTER — Encounter: Payer: BC Managed Care – PPO | Admitting: Internal Medicine

## 2014-02-15 ENCOUNTER — Encounter: Payer: Self-pay | Admitting: *Deleted

## 2014-02-22 ENCOUNTER — Ambulatory Visit (INDEPENDENT_AMBULATORY_CARE_PROVIDER_SITE_OTHER): Payer: BC Managed Care – PPO | Admitting: *Deleted

## 2014-02-22 ENCOUNTER — Ambulatory Visit (INDEPENDENT_AMBULATORY_CARE_PROVIDER_SITE_OTHER): Payer: BC Managed Care – PPO | Admitting: Internal Medicine

## 2014-02-22 ENCOUNTER — Encounter: Payer: Self-pay | Admitting: Internal Medicine

## 2014-02-22 VITALS — BP 130/72 | HR 98 | Temp 99.3°F | Resp 18 | Ht 66.5 in | Wt 173.0 lb

## 2014-02-22 DIAGNOSIS — Z0000 Encounter for general adult medical examination without abnormal findings: Secondary | ICD-10-CM

## 2014-02-22 DIAGNOSIS — Z23 Encounter for immunization: Secondary | ICD-10-CM

## 2014-02-22 DIAGNOSIS — E785 Hyperlipidemia, unspecified: Secondary | ICD-10-CM

## 2014-02-22 DIAGNOSIS — J309 Allergic rhinitis, unspecified: Secondary | ICD-10-CM

## 2014-02-22 DIAGNOSIS — G4452 New daily persistent headache (NDPH): Secondary | ICD-10-CM

## 2014-02-22 DIAGNOSIS — L74519 Primary focal hyperhidrosis, unspecified: Secondary | ICD-10-CM

## 2014-02-22 NOTE — Progress Notes (Signed)
Patient ID: Mariah HartshornBrittany Rocha, female   DOB: 1985/09/08, 28 y.o.   MRN: [id number]    Chief Complaint  Patient presents with  . Annual Exam   No Known Allergies  HPI 28 y/o female patient is here for her annual visit.  Has regular menstural cycles. Is on birth control pills. Is sexually active and uses condoms on regular basis. Her headaches have been getting more frequent. Has been waking up with headaches recently and goes to bed with headaches at times. No headache this am. Her headache can last for 2-3 days at times. She has aura prior to the headache. Bright lights, strong perfume smell and noise can worsen it. Imitrex, turning off the noise source and lights help her.  She has been experiencing excessive sweating for 2 months. She has gained 6 lbs since last visit  Review of Systems  Constitutional: Negative for fever, chills, weight loss, malaise/fatigue. She walks and runs on treadmill 2-3 days a week.  HENT: Negative for sore throat. Has recurrent nasal congestion. Has not had her influenza vaccine . 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 wears corrective lenses Respiratory: Negative for cough, sputum production, shortness of breath and wheezing.   Cardiovascular: Negative for chest pain, palpitations, orthopnea and leg swelling.  Gastrointestinal: Negative for heartburn, nausea, vomiting, abdominal pain. Has bowel movement once every 3-4 days. Denies blood in stool Genitourinary: Negative for dysuria, urgency, frequency and flank pain. has Gyn- last pap smear 05/2013 normal. No vaginal discharge Musculoskeletal: Negative for back pain, falls, joint pain and myalgias.  Skin: Negative for itching and rash.  Neurological: Negative for dizziness, tingling, focal weakness. Psychiatric/Behavioral: Negative for depression. The patient is not nervous/anxious. Sleep cycle is good   Wt Readings from Last 3 Encounters:  02/22/14 173 lb  (78.472 kg)  04/19/13 167 lb 9.6 oz (76.023 kg)  02/09/13 165 lb 3.2 oz (74.934 kg)     Past Medical History  Diagnosis Date  . Allergic rhinitis   . Lower back pain    Past Surgical History  Procedure Laterality Date  . Hernia repair  1989     Dr.Kathleen Samuel BoucheLucas    Current Outpatient Prescriptions on File Prior to Visit  Medication Sig Dispense Refill  . Levonorgest-Eth Estrad 91-Day (SEASONIQUE PO) Take by mouth.      . SUMAtriptan (IMITREX) 50 MG tablet Take one tablet once a day as needed or headache. May repeat in 2 hours if headache persists or recurs. Do not exceed 100 mg in a  day 30 tablet 3   No current facility-administered medications on file prior to visit.    Family History  Problem Relation Age of Onset  . Breast cancer Maternal Aunt   . Hypertension Mother    History   Social History  . Marital Status: Single    Spouse Name: N/A    Number of Children: N/A  . Years of Education: N/A   Occupational History  . Not on file.   Social History Main Topics  . Smoking status: Never Smoker   . Smokeless tobacco: Not on file  . Alcohol Use: Yes     Comment: once a week  . Drug Use: No  . Sexual Activity: Yes    Birth Control/ Protection: Pill   Other Topics Concern  . Not on file   Social History Narrative   GYN- Dr.Beth Maurice MarchLane    ENT- Dr. Flo ShanksKarol Wolicki   Physical exam BP  130/72 mmHg  Pulse 98  Temp(Src) 99.3 F (37.4 C) (Oral)  Resp 18  Ht 5' 6.5" (1.689 m)  Wt 173 lb (78.472 kg)  BMI 27.51 kg/m2  SpO2 98%  Constitutional: She is oriented to person, place, and time. She appears well-developed and well-nourished. No distress.  HENT:   Head: Normocephalic and atraumatic.  Right Ear: External ear normal.  Left Ear: External ear normal.   Nose: Nose normal.   Mouth/Throat: Oropharynx is clear and moist. No oropharyngeal exudate.  Eyes: Conjunctivae and EOM are normal. Pupils are equal, round, and reactive to light. Right eye exhibits no  discharge. Left eye exhibits no discharge. No scleral icterus.  Neck: Normal range of motion. Neck supple. No JVD present. No tracheal deviation present. No thyromegaly present.  Cardiovascular: Normal rate, regular rhythm, normal heart sounds and intact distal pulses.  Exam reveals no friction rub.    No murmur heard. Pulmonary/Chest: Effort normal and breath sounds normal. No stridor. No respiratory distress. She has no wheezes. She has no rales. She exhibits no tenderness.  Abdominal: Soft. Bowel sounds are normal. She exhibits no distension and no mass. There is no tenderness. There is no rebound and no guarding.  Genitourinary: Done by her ob gyn  Musculoskeletal: Normal range of motion. She exhibits no edema and no tenderness.  Lymphadenopathy:    She has no cervical adenopathy.  Neurological: She is alert and oriented to person, place, and time. She has normal reflexes. She displays normal reflexes. No cranial nerve deficit. She exhibits normal muscle tone. Coordination normal.  Skin: Skin is warm and dry. No rash noted. She is not diaphoretic. No erythema. No pallor.  Has birth mark in left upper back and tattoo on right upper back  Psychiatric: She has a normal mood and affect. Her behavior is normal. Judgment and thought content normal.     Labs reviewed: CBC    Component Value Date/Time   WBC 5.5 02/10/2014 0919   RBC 4.32 02/10/2014 0919   HGB 12.6 02/10/2014 0919   HCT 38.2 02/10/2014 0919   PLT 343 02/10/2014 0919   MCV 88 02/10/2014 0919   MCH 29.2 02/10/2014 0919   MCHC 33.0 02/10/2014 0919   RDW 13.4 02/10/2014 0919   LYMPHSABS 1.8 01/12/2013 0953   EOSABS 0.1 01/12/2013 0953   BASOSABS 0.0 01/12/2013 0953    CMP     Component Value Date/Time   NA 138 02/10/2014 0919   K 4.5 02/10/2014 0919   CL 103 02/10/2014 0919   CO2 21 02/10/2014 0919   GLUCOSE 90 02/10/2014 0919   BUN 7 02/10/2014 0919   CREATININE 0.71 02/10/2014 0919   CALCIUM 9.2 02/10/2014 0919     PROT 6.9 02/10/2014 0919   AST 14 02/10/2014 0919   ALT 13 02/10/2014 0919   ALKPHOS 40 02/10/2014 0919   BILITOT 0.4 02/10/2014 0919   GFRNONAA 118 02/10/2014 0919   GFRAA 136 02/10/2014 0919   Lipid Panel     Component Value Date/Time   TRIG 121 02/10/2014 0919   HDL 52 02/10/2014 0919   CHOLHDL 3.4 02/10/2014 0919   LDLCALC 102* 02/10/2014 0919   Lab Results  Component Value Date   TSH 0.646 01/12/2013    Assessment/Plan  1. New daily persistent headache imitrex not helping. From hx appears to be migraine headache. Will refer her to headache specialist.  - AMB referral to headache clinic  2. Routine general medical examination at a health care facility the  patient was counseled regarding the appropriate use of alcohol, regular self-examination of the breasts on a monthly basis, prevention of dental and periodontal disease, diet, regular sustained exercise for at least 30 minutes 5 times per week, the proper use of sunscreen and protective clothing, tobacco use. Will provide influenza vaccine.   3. Excessive sweating, local tsh in past normal. bp stable. Weight stable. No palpable lymph nodes. rechekc complete thyroid panel - TSH - T4, Free - T3  4. Allergic rhinitis, unspecified allergic rhinitis type Stable overall with intermittent nasal congestion. Can use OTC nasal saline spray prn.  5. Hyperlipidemia Controlled. Weight loss encouraged.

## 2014-02-23 LAB — TSH: TSH: 0.005 u[IU]/mL — ABNORMAL LOW (ref 0.450–4.500)

## 2014-02-23 LAB — T4, FREE: Free T4: 1.57 ng/dL (ref 0.82–1.77)

## 2014-02-23 LAB — T3: T3, Total: 247 ng/dL — ABNORMAL HIGH (ref 71–180)

## 2014-03-01 ENCOUNTER — Telehealth: Payer: Self-pay | Admitting: *Deleted

## 2014-03-01 ENCOUNTER — Other Ambulatory Visit: Payer: Self-pay | Admitting: *Deleted

## 2014-03-01 DIAGNOSIS — E039 Hypothyroidism, unspecified: Secondary | ICD-10-CM

## 2014-03-01 NOTE — Telephone Encounter (Signed)
Spoke with patient regarding lab results, I informed her that Dr. Renato Gailseed has decided to check another lab for further assessment and we will let her know when we receive the results of this test. I also informed her that Dr. Renato Gailseed has referred her to endocrinology for further assessment of her thyroid activity.

## 2014-03-01 NOTE — Telephone Encounter (Signed)
-----   Message from Oneal GroutMahima Pandey, MD sent at 02/28/2014  6:27 PM EST ----- Your lab work is suggestive of overactive thyroid glands. This could be contributing to excessive sweating. i would like to get TRAb - please add this to lab- thyroid receptor antibody (diagnosis- hyperthyroidism) to assess further. Also provide referral to endocrinology.

## 2014-03-02 LAB — THYROTROPIN RECEPTOR ANTIBODY: Thyrotropin Receptor Ab: 3.05 IU/L — ABNORMAL HIGH (ref 0.00–1.75)

## 2014-03-02 LAB — SPECIMEN STATUS REPORT

## 2014-04-11 ENCOUNTER — Emergency Department (HOSPITAL_COMMUNITY)
Admission: EM | Admit: 2014-04-11 | Discharge: 2014-04-11 | Disposition: A | Discharge status: Home / Self Care | Payer: BLUE CROSS/BLUE SHIELD | Source: Home / Self Care | Attending: Family Medicine | Admitting: Family Medicine

## 2014-04-11 ENCOUNTER — Encounter (HOSPITAL_COMMUNITY): Payer: Self-pay | Admitting: *Deleted

## 2014-04-11 DIAGNOSIS — J111 Influenza due to unidentified influenza virus with other respiratory manifestations: Secondary | ICD-10-CM

## 2014-04-11 DIAGNOSIS — R69 Illness, unspecified: Principal | ICD-10-CM

## 2014-04-11 MED ORDER — ONDANSETRON HCL 4 MG PO TABS: 4.0000 mg | ORAL_TABLET | Freq: Four times a day (QID) | ORAL | Status: DC

## 2014-04-11 MED ORDER — IPRATROPIUM BROMIDE 0.06 % NA SOLN: 2.0000 | Freq: Four times a day (QID) | NASAL | Status: DC

## 2014-04-11 MED ORDER — ONDANSETRON 4 MG PO TBDP
ORAL_TABLET | ORAL | Status: AC
  Filled 2014-04-11: qty 1

## 2014-04-11 MED ORDER — HYDROCOD POLST-CHLORPHEN POLST 10-8 MG/5ML PO LQCR: 5.0000 mL | Freq: Two times a day (BID) | ORAL | Status: DC | PRN

## 2014-04-11 MED ORDER — ONDANSETRON 4 MG PO TBDP
4.0000 mg | ORAL_TABLET | Freq: Once | ORAL | Status: AC
  Administered 2014-04-11: 4 mg via ORAL

## 2014-04-11 NOTE — ED Provider Notes (Signed)
CSN: [id number]     Arrival date & time 04/11/14  0901 History   First MD Initiated Contact with Patient 04/11/14 0915     Chief Complaint  Patient presents with  . URI   (Consider location/radiation/quality/duration/timing/severity/associated sxs/prior Treatment) Patient is a 29 y.o. female presenting with URI. The history is provided by the patient.  URI Presenting symptoms: congestion, cough and rhinorrhea   Presenting symptoms: no fever   Severity:  Mild Onset quality:  Sudden Duration:  4 days Progression:  Unchanged Chronicity:  New Associated symptoms: myalgias   Risk factors comment:  Had flu vacc in nov this season.   Past Medical History  Diagnosis Date  . Allergic rhinitis   . Lower back pain    Past Surgical History  Procedure Laterality Date  . Hernia repair  1989     Dr.Kathleen Samuel Bouche    Family History  Problem Relation Age of Onset  . Breast cancer Maternal Aunt   . Hypertension Mother    History  Substance Use Topics  . Smoking status: Never Smoker   . Smokeless tobacco: Not on file  . Alcohol Use: Yes     Comment: once a week   OB History    No data available     Review of Systems  Constitutional: Positive for chills. Negative for fever.  HENT: Positive for congestion and rhinorrhea.   Respiratory: Positive for cough.   Gastrointestinal: Positive for nausea and vomiting. Negative for diarrhea.  Musculoskeletal: Positive for myalgias.  Skin: Negative.     Allergies  Review of patient's allergies indicates no known allergies.  Home Medications   Prior to Admission medications   Medication Sig Start Date End Date Taking? Authorizing Provider  chlorpheniramine-HYDROcodone (TUSSIONEX PENNKINETIC ER) 10-8 MG/5ML LQCR Take 5 mLs by mouth every 12 (twelve) hours as needed for cough. 04/11/14   Linna Hoff, MD  ipratropium (ATROVENT) 0.06 % nasal spray Place 2 sprays into both nostrils 4 (four) times daily. 04/11/14   Linna Hoff, MD   Levonorgest-Eth Charlott Holler 91-Day (SEASONIQUE PO) Take by mouth.      Historical Provider, MD  ondansetron (ZOFRAN) 4 MG tablet Take 1 tablet (4 mg total) by mouth every 6 (six) hours. 04/11/14   Linna Hoff, MD  SUMAtriptan (IMITREX) 50 MG tablet Take one tablet once a day as needed or headache. May repeat in 2 hours if headache persists or recurs. Do not exceed 100 mg in a  day 04/19/13   Mahima Pandey, MD   BP 132/79 mmHg  Pulse 134  Temp(Src) 98.3 F (36.8 C) (Oral)  Resp 20  SpO2 98%  LMP 04/06/2014 Physical Exam  Constitutional: She is oriented to person, place, and time. She appears well-developed and well-nourished. No distress.  HENT:  Right Ear: External ear normal.  Left Ear: External ear normal.  Mouth/Throat: Oropharynx is clear and moist.  Eyes: Pupils are equal, round, and reactive to light.  Neck: Normal range of motion. Neck supple.  Cardiovascular: Normal heart sounds.   Pulmonary/Chest: Effort normal and breath sounds normal.  Abdominal: Soft. Bowel sounds are normal. She exhibits no mass. There is no tenderness. There is no rebound and no guarding.  Lymphadenopathy:    She has no cervical adenopathy.  Neurological: She is alert and oriented to person, place, and time.  Skin: Skin is warm and dry.  Nursing note and vitals reviewed.   ED Course  Procedures (including critical care time) Labs Review Labs Reviewed -  No data to display  Imaging Review No results found.   MDM   1. Influenza-like illness        Linna HoffJames D Brionne Mertz, MD 04/11/14 [phone]

## 2014-04-11 NOTE — ED Notes (Signed)
Pt   Reports        Symptoms  Of         Cough  Stuffy nose  -    Body  Aches               With  Symptoms            X   3  Days  Pt  repotys  Has d  Some  Nausea   /  Vomiting         Which  Stopped  Yesterday

## 2014-05-03 ENCOUNTER — Other Ambulatory Visit (HOSPITAL_COMMUNITY): Payer: Self-pay | Admitting: Endocrinology

## 2014-05-03 DIAGNOSIS — E0590 Thyrotoxicosis, unspecified without thyrotoxic crisis or storm: Secondary | ICD-10-CM

## 2014-05-08 ENCOUNTER — Encounter (HOSPITAL_COMMUNITY)
Admission: RE | Admit: 2014-05-08 | Discharge: 2014-05-08 | Disposition: A | Discharge status: Home / Self Care | Payer: BLUE CROSS/BLUE SHIELD | Source: Ambulatory Visit | Attending: Endocrinology | Admitting: Endocrinology

## 2014-05-08 DIAGNOSIS — E0590 Thyrotoxicosis, unspecified without thyrotoxic crisis or storm: Secondary | ICD-10-CM

## 2014-05-08 MED ORDER — SODIUM IODIDE I 131 CAPSULE
13.8000 | Freq: Once | INTRAVENOUS | Status: AC | PRN
  Administered 2014-05-08: 13.8 via ORAL

## 2014-05-09 ENCOUNTER — Encounter (HOSPITAL_COMMUNITY)
Admission: RE | Admit: 2014-05-09 | Discharge: 2014-05-09 | Disposition: A | Discharge status: Home / Self Care | Payer: BLUE CROSS/BLUE SHIELD | Source: Ambulatory Visit | Attending: Endocrinology | Admitting: Endocrinology

## 2014-05-09 DIAGNOSIS — E0590 Thyrotoxicosis, unspecified without thyrotoxic crisis or storm: Secondary | ICD-10-CM | POA: Diagnosis present

## 2014-05-09 MED ORDER — SODIUM PERTECHNETATE TC 99M INJECTION
10.0000 | Freq: Once | INTRAVENOUS | Status: AC | PRN
  Administered 2014-05-09: 10 via INTRAVENOUS

## 2015-04-03 ENCOUNTER — Encounter: Payer: Self-pay | Admitting: Nurse Practitioner

## 2015-04-03 ENCOUNTER — Ambulatory Visit (INDEPENDENT_AMBULATORY_CARE_PROVIDER_SITE_OTHER): Payer: BLUE CROSS/BLUE SHIELD | Admitting: Nurse Practitioner

## 2015-04-03 VITALS — BP 128/86 | HR 94 | Temp 98.4°F | Resp 18 | Ht 66.0 in | Wt 181.0 lb

## 2015-04-03 DIAGNOSIS — E0590 Thyrotoxicosis, unspecified without thyrotoxic crisis or storm: Secondary | ICD-10-CM

## 2015-04-03 DIAGNOSIS — Z23 Encounter for immunization: Secondary | ICD-10-CM

## 2015-04-03 DIAGNOSIS — R519 Headache, unspecified: Secondary | ICD-10-CM

## 2015-04-03 DIAGNOSIS — E785 Hyperlipidemia, unspecified: Secondary | ICD-10-CM

## 2015-04-03 DIAGNOSIS — R51 Headache: Secondary | ICD-10-CM

## 2015-04-03 DIAGNOSIS — Z0000 Encounter for general adult medical examination without abnormal findings: Secondary | ICD-10-CM

## 2015-04-03 NOTE — Patient Instructions (Addendum)
Colace 100 mg by mouth 1-2 times daily  Exercise recommended 30 mins/5 days a week Heart healthy diet Dental exam every 6 months  Please make appt for fasting lab work Follow up in 1 year for routine follow up.    Constipation, Adult Constipation is when a person has fewer than three bowel movements a week, has difficulty having a bowel movement, or has stools that are dry, hard, or larger than normal. As people grow older, constipation is more common. A low-fiber diet, not taking in enough fluids, and taking certain medicines may make constipation worse.  CAUSES   Certain medicines, such as antidepressants, pain medicine, iron supplements, antacids, and water pills.   Certain diseases, such as diabetes, irritable bowel syndrome (IBS), thyroid disease, or depression.   Not drinking enough water.   Not eating enough fiber-rich foods.   Stress or travel.   Lack of physical activity or exercise.   Ignoring the urge to have a bowel movement.   Using laxatives too much.  SIGNS AND SYMPTOMS   Having fewer than three bowel movements a week.   Straining to have a bowel movement.   Having stools that are hard, dry, or larger than normal.   Feeling full or bloated.   Pain in the lower abdomen.   Not feeling relief after having a bowel movement.  DIAGNOSIS  Your health care provider will take a medical history and perform a physical exam. Further testing may be done for severe constipation. Some tests may include:  A barium enema X-ray to examine your rectum, colon, and, sometimes, your small intestine.   A sigmoidoscopy to examine your lower colon.   A colonoscopy to examine your entire colon. TREATMENT  Treatment will depend on the severity of your constipation and what is causing it. Some dietary treatments include drinking more fluids and eating more fiber-rich foods. Lifestyle treatments may include regular exercise. If these diet and lifestyle  recommendations do not help, your health care provider may recommend taking over-the-counter laxative medicines to help you have bowel movements. Prescription medicines may be prescribed if over-the-counter medicines do not work.  HOME CARE INSTRUCTIONS   Eat foods that have a lot of fiber, such as fruits, vegetables, whole grains, and beans.  Limit foods high in fat and processed sugars, such as french fries, hamburgers, cookies, candies, and soda.   A fiber supplement may be added to your diet if you cannot get enough fiber from foods.   Drink enough fluids to keep your urine clear or pale yellow.   Exercise regularly or as directed by your health care provider.   Go to the restroom when you have the urge to go. Do not hold it.   Only take over-the-counter or prescription medicines as directed by your health care provider. Do not take other medicines for constipation without talking to your health care provider first.  [place] IF:   You have bright red blood in your stool.   Your constipation lasts for more than 4 days or gets worse.   You have abdominal or rectal pain.   You have thin, pencil-like stools.   You have unexplained weight loss. MAKE SURE YOU:   Understand these instructions.  Will watch your condition.  Will get help right away if you are not doing well or get worse.   This information is not intended to replace advice given to you by your health care provider. Make sure you discuss any questions you have  with your health care provider.   Document Released: 12/14/2003 Document Revised: 04/07/2014 Document Reviewed: 12/27/2012 Elsevier Interactive Patient Education 2016 [place] Maintenance, Female Adopting a healthy lifestyle and getting preventive care can go a long way to promote health and wellness. Talk with your health care provider about what schedule of regular examinations is right for you. This is a good  chance for you to check in with your provider about disease prevention and staying healthy. In between checkups, there are plenty of things you can do on your own. Experts have done a lot of research about which lifestyle changes and preventive measures are most likely to keep you healthy. Ask your health care provider for more information. WEIGHT AND DIET  Eat a healthy diet  Be sure to include plenty of vegetables, fruits, low-fat dairy products, and lean protein.  Do not eat a lot of foods high in solid fats, added sugars, or salt.  Get regular exercise. This is one of the most important things you can do for your health.  Most adults should exercise for at least 150 minutes each week. The exercise should increase your heart rate and make you sweat (moderate-intensity exercise).  Most adults should also do strengthening exercises at least twice a week. This is in addition to the moderate-intensity exercise.  Maintain a healthy weight  Body mass index (BMI) is a measurement that can be used to identify possible weight problems. It estimates body fat based on height and weight. Your health care provider can help determine your BMI and help you achieve or maintain a healthy weight.  For females 63 years of age and older:   A BMI below 18.5 is considered underweight.  A BMI of 18.5 to 24.9 is normal.  A BMI of 25 to 29.9 is considered overweight.  A BMI of 30 and above is considered obese.  Watch levels of cholesterol and blood lipids  You should start having your blood tested for lipids and cholesterol at 28 years of age, then have this test every 5 years.  You may need to have your cholesterol levels checked more often if:  Your lipid or cholesterol levels are high.  You are older than 28 years of age.  You are at high risk for heart disease.  CANCER SCREENING   Lung Cancer  Lung cancer screening is recommended for adults 12-36 years old who are at high risk for lung  cancer because of a history of smoking.  A yearly low-dose CT scan of the lungs is recommended for people who:  Currently smoke.  Have quit within the past 15 years.  Have at least a 30-pack-year history of smoking. A pack year is smoking an average of one pack of cigarettes a day for 1 year.  Yearly screening should continue until it has been 15 years since you quit.  Yearly screening should stop if you develop a health problem that would prevent you from having lung cancer treatment.  Breast Cancer  Practice breast self-awareness. This means understanding how your breasts normally appear and feel.  It also means doing regular breast self-exams. Let your health care provider know about any changes, no matter how small.  If you are in your 20s or 30s, you should have a clinical breast exam (CBE) by a health care provider every 1-3 years as part of a regular health exam.  If you are 39 or older, have a CBE every year. Also consider having a breast  X-ray (mammogram) every year.  If you have a family history of breast cancer, talk to your health care provider about genetic screening.  If you are at high risk for breast cancer, talk to your health care provider about having an MRI and a mammogram every year.  Breast cancer gene (BRCA) assessment is recommended for women who have family members with BRCA-related cancers. BRCA-related cancers include:  Breast.  Ovarian.  Tubal.  Peritoneal cancers.  Results of the assessment will determine the need for genetic counseling and BRCA1 and BRCA2 testing. Cervical Cancer Your health care provider may recommend that you be screened regularly for cancer of the pelvic organs (ovaries, uterus, and vagina). This screening involves a pelvic examination, including checking for microscopic changes to the surface of your cervix (Pap test). You may be encouraged to have this screening done every 3 years, beginning at age 17.  For women ages  48-65, health care providers may recommend pelvic exams and Pap testing every 3 years, or they may recommend the Pap and pelvic exam, combined with testing for human papilloma virus (HPV), every 5 years. Some types of HPV increase your risk of cervical cancer. Testing for HPV may also be done on women of any age with unclear Pap test results.  Other health care providers may not recommend any screening for nonpregnant women who are considered low risk for pelvic cancer and who do not have symptoms. Ask your health care provider if a screening pelvic exam is right for you.  If you have had past treatment for cervical cancer or a condition that could lead to cancer, you need Pap tests and screening for cancer for at least 20 years after your treatment. If Pap tests have been discontinued, your risk factors (such as having a new sexual partner) need to be reassessed to determine if screening should resume. Some women have medical problems that increase the chance of getting cervical cancer. In these cases, your health care provider may recommend more frequent screening and Pap tests. Colorectal Cancer  This type of cancer can be detected and often prevented.  Routine colorectal cancer screening usually begins at 28 years of age and continues through 28 years of age.  Your health care provider may recommend screening at an earlier age if you have risk factors for colon cancer.  Your health care provider may also recommend using home test kits to check for hidden blood in the stool.  A small camera at the end of a tube can be used to examine your colon directly (sigmoidoscopy or colonoscopy). This is done to check for the earliest forms of colorectal cancer.  Routine screening usually begins at age 57.  Direct examination of the colon should be repeated every 5-10 years through 28 years of age. However, you may need to be screened more often if early forms of precancerous polyps or small growths are  found. Skin Cancer  Check your skin from head to toe regularly.  Tell your health care provider about any new moles or changes in moles, especially if there is a change in a mole's shape or color.  Also tell your health care provider if you have a mole that is larger than the size of a pencil eraser.  Always use sunscreen. Apply sunscreen liberally and repeatedly throughout the day.  Protect yourself by wearing long sleeves, pants, a wide-brimmed hat, and sunglasses whenever you are outside. HEART DISEASE, DIABETES, AND HIGH BLOOD PRESSURE   High blood pressure causes heart  disease and increases the risk of stroke. High blood pressure is more likely to develop in:  People who have blood pressure in the high end of the normal range (130-139/85-89 mm Hg).  People who are overweight or obese.  People who are African American.  If you are 63-55 years of age, have your blood pressure checked every 3-5 years. If you are 61 years of age or older, have your blood pressure checked every year. You should have your blood pressure measured twice--once when you are at a hospital or clinic, and once when you are not at a hospital or clinic. Record the average of the two measurements. To check your blood pressure when you are not at a hospital or clinic, you can use:  An automated blood pressure machine at a pharmacy.  A home blood pressure monitor.  If you are between 82 years and 30 years old, ask your health care provider if you should take aspirin to prevent strokes.  Have regular diabetes screenings. This involves taking a blood sample to check your fasting blood sugar level.  If you are at a normal weight and have a low risk for diabetes, have this test once every three years after 28 years of age.  If you are overweight and have a high risk for diabetes, consider being tested at a younger age or more often. PREVENTING INFECTION  Hepatitis B  If you have a higher risk for hepatitis B,  you should be screened for this virus. You are considered at high risk for hepatitis B if:  You were born in a country where hepatitis B is common. Ask your health care provider which countries are considered high risk.  Your parents were born in a high-risk country, and you have not been immunized against hepatitis B (hepatitis B vaccine).  You have HIV or AIDS.  You use needles to inject street drugs.  You live with someone who has hepatitis B.  You have had sex with someone who has hepatitis B.  You get hemodialysis treatment.  You take certain medicines for conditions, including cancer, organ transplantation, and autoimmune conditions. Hepatitis C  Blood testing is recommended for:  Everyone born from [birth year] through 1963.  Anyone with known risk factors for hepatitis C. Sexually transmitted infections (STIs)  You should be screened for sexually transmitted infections (STIs) including gonorrhea and chlamydia if:  You are sexually active and are younger than 28 years of age.  You are older than 28 years of age and your health care provider tells you that you are at risk for this type of infection.  Your sexual activity has changed since you were last screened and you are at an increased risk for chlamydia or gonorrhea. Ask your health care provider if you are at risk.  If you do not have HIV, but are at risk, it may be recommended that you take a prescription medicine daily to prevent HIV infection. This is called pre-exposure prophylaxis (PrEP). You are considered at risk if:  You are sexually active and do not regularly use condoms or know the HIV status of your partner(s).  You take drugs by injection.  You are sexually active with a partner who has HIV. Talk with your health care provider about whether you are at high risk of being infected with HIV. If you choose to begin PrEP, you should first be tested for HIV. You should then be tested every 3 months for as long as  you are taking  PrEP.  PREGNANCY   If you are premenopausal and you may become pregnant, ask your health care provider about preconception counseling.  If you may become pregnant, take 400 to 800 micrograms (mcg) of folic acid every day.  If you want to prevent pregnancy, talk to your health care provider about birth control (contraception). OSTEOPOROSIS AND MENOPAUSE   Osteoporosis is a disease in which the bones lose minerals and strength with aging. This can result in serious bone fractures. Your risk for osteoporosis can be identified using a bone density scan.  If you are 16 years of age or older, or if you are at risk for osteoporosis and fractures, ask your health care provider if you should be screened.  Ask your health care provider whether you should take a calcium or vitamin D supplement to lower your risk for osteoporosis.  Menopause may have certain physical symptoms and risks.  Hormone replacement therapy may reduce some of these symptoms and risks. Talk to your health care provider about whether hormone replacement therapy is right for you.  HOME CARE INSTRUCTIONS   Schedule regular health, dental, and eye exams.  Stay current with your immunizations.   Do not use any tobacco products including cigarettes, chewing tobacco, or electronic cigarettes.  If you are pregnant, do not drink alcohol.  If you are breastfeeding, limit how much and how often you drink alcohol.  Limit alcohol intake to no more than 1 drink per day for nonpregnant women. One drink equals 12 ounces of beer, 5 ounces of wine, or 1 ounces of hard liquor.  Do not use street drugs.  Do not share needles.  Ask your health care provider for help if you need support or information about quitting drugs.  Tell your health care provider if you often feel depressed.  Tell your health care provider if you have ever been abused or do not feel safe at home.   This information is not intended to replace  advice given to you by your health care provider. Make sure you discuss any questions you have with your health care provider.   Document Released: 09/30/2010 Document Revised: 04/07/2014 Document Reviewed: 02/16/2013 Elsevier Interactive Patient Education [employer].

## 2015-04-03 NOTE — Progress Notes (Signed)
Patient ID: Mariah HartshornBrittany Rocha, female   DOB: 06-Jan-1986, 30 y.o.   MRN: [id number]    PCP: Sharon SellerEUBANKS, Jordynne Mccown K, NP  No Known Allergies  Chief Complaint  Patient presents with  . Annual Exam    Annual Exam  . Immunizations    Will take flu shot today     HPI: Patient is a 30 y.o. female seen in the office today for annual exam. Pt with a hx of seen low back pain, allergic rhinitis and headaches. Pt was last seen in Nov 2015. Since last visit has been doing well. Went to urgent care due to flu like symptoms in January of 2016.  Pt follows with GYN yearly.- PAP done September or October 2016.   Has been following with endocrine regarding hyperactive thyroid.  Saw headache specialist, had injections. No recent headaches  Mentions menses for last 2 weeks- going away now.  Taking birth control pill- satisfied with this.  Does not follow any diet No exercise Has not been to dentist recently Eye doctor yearly   Review of Systems:  Review of Systems  Constitutional: Negative for activity change, appetite change, fatigue and unexpected weight change.  HENT: Negative for congestion and hearing loss.   Eyes: Negative.   Respiratory: Negative for cough and shortness of breath.   Cardiovascular: Negative for chest pain, palpitations and leg swelling.  Gastrointestinal: Negative for abdominal pain, diarrhea and constipation.  Genitourinary: Negative for dysuria and difficulty urinating.  Musculoskeletal: Negative for myalgias and arthralgias.  Skin: Negative for color change and wound.  Neurological: Negative for dizziness and weakness.  Psychiatric/Behavioral: Negative for behavioral problems, confusion and agitation.    Past Medical History  Diagnosis Date  . Allergic rhinitis   . Lower back pain    Past Surgical History  Procedure Laterality Date  . Hernia repair  1989     Dr.Kathleen Samuel BoucheLucas    Social History:   reports that she has never smoked. She has never used smokeless  tobacco. She reports that she drinks alcohol. She reports that she does not use illicit drugs.  Family History  Problem Relation Age of Onset  . Breast cancer Maternal Aunt   . Hypertension Mother     Medications: Patient's Medications  New Prescriptions   No medications on file  Previous Medications   BACLOFEN (LIORESAL) 10 MG TABLET    Take 1/2 to 1 tablet by mouth as needed twice daily for headache limit to 2 headache days per week avoid daily use   CHLORPHENIRAMINE-HYDROCODONE (TUSSIONEX PENNKINETIC ER) 10-8 MG/5ML LQCR    Take 5 mLs by mouth every 12 (twelve) hours as needed for cough.   IPRATROPIUM (ATROVENT) 0.06 % NASAL SPRAY    Place 2 sprays into both nostrils 4 (four) times daily.   LEVONORGESTREL-ETHINYL ESTRADIOL (SETLAKIN) 0.15-0.03 MG TABLET    Take 1 tablet by mouth daily.   METHIMAZOLE (TAPAZOLE) 5 MG TABLET    Take 5 mg by mouth daily. With food   ONDANSETRON (ZOFRAN) 4 MG TABLET    Take 1 tablet (4 mg total) by mouth every 6 (six) hours.   SUMATRIPTAN (IMITREX) 50 MG TABLET    Take one tablet once a day as needed or headache. May repeat in 2 hours if headache persists or recurs. Do not exceed 100 mg in a  day   ZONISAMIDE (ZONEGRAN) 25 MG CAPSULE    Take 25 mg by mouth at bedtime.  Modified Medications   No medications on file  Discontinued  Medications   LEVONORGEST-ETH ESTRAD 91-DAY (SEASONIQUE PO)    Take by mouth.       Physical Exam:  Filed Vitals:   04/03/15 1333  BP: 128/86  Pulse: 94  Temp: 98.4 F (36.9 C)  TempSrc: Oral  Resp: 18  Height: 5\' 6"  (1.676 m)  Weight: 181 lb (82.101 kg)  SpO2: 98%   Body mass index is 29.23 kg/(m^2).  Physical Exam  Constitutional: She is oriented to person, place, and time. She appears well-developed and well-nourished. No distress.  HENT:  Head: Normocephalic and atraumatic.  Right Ear: External ear normal.  Left Ear: External ear normal.  Nose: Nose normal.  Mouth/Throat: Oropharynx is clear and moist. No  oropharyngeal exudate.  Eyes: Conjunctivae and EOM are normal. Pupils are equal, round, and reactive to light.  Neck: Normal range of motion. Neck supple. No JVD present. No thyromegaly present.  Cardiovascular: Normal rate, regular rhythm and normal heart sounds.   Pulmonary/Chest: Effort normal and breath sounds normal.  Abdominal: Soft. Bowel sounds are normal.  Genitourinary:  Done by GYN  Musculoskeletal: Normal range of motion. She exhibits no edema or tenderness.  Lymphadenopathy:    She has no cervical adenopathy.  Neurological: She is alert and oriented to person, place, and time.  Skin: Skin is warm and dry. She is not diaphoretic.  Psychiatric: She has a normal mood and affect.    Labs reviewed: Basic Metabolic Panel: No results for input(s): NA, K, CL, CO2, GLUCOSE, BUN, CREATININE, CALCIUM, MG, PHOS, TSH in the last 8760 hours. Liver Function Tests: No results for input(s): AST, ALT, ALKPHOS, BILITOT, PROT, ALBUMIN in the last 8760 hours. No results for input(s): LIPASE, AMYLASE in the last 8760 hours. No results for input(s): AMMONIA in the last 8760 hours. CBC: No results for input(s): WBC, NEUTROABS, HGB, HCT, MCV, PLT in the last 8760 hours. Lipid Panel: No results for input(s): CHOL, HDL, LDLCALC, TRIG, CHOLHDL, LDLDIRECT in the last 8760 hours. TSH: No results for input(s): TSH in the last 8760 hours. A1C: No results found for: HGBA1C   Assessment/Plan 1. Routine general medical examination at a health care facility  The patient is doing well and no distinct problems were identified on exam. the patient was counseled regarding the appropriate use of alcohol, regular self-examination of the breasts on a monthly basis, prevention of dental and periodontal disease, diet, regular sustained exercise for at least 30 minutes 5 times per week,  importance of regular PAP smears, and recommended schedule for GI hemoccult testing, colonoscopy, cholesterol, thyroid and  diabetes screening. -to notify GYN if menses becomes irregular with longer duration - CBC with Differential; Future  2. Hyperlipidemia -encouraged diet modifications and to start exercise - Lipid panel; Future - Comprehensive metabolic panel; Future  3. Temporal headache -stable, remains headache free after following with headache specialist   4. Hyperthyroidism -following with endocrine. conts on tapazole daily   5. Constipation -increase fluid intake, increase activity, increase fiber -may use colace 100 mg PO 1-2 times daily   Follow up for fasting labs later this week  Follow up in 1 year for routine follow up  Vermell Madrid K. Biagio Borg  [facility] [phone] 8 am - 5 pm) [phone] (after hours)

## 2015-04-11 ENCOUNTER — Other Ambulatory Visit: Payer: BLUE CROSS/BLUE SHIELD

## 2015-04-11 DIAGNOSIS — Z0000 Encounter for general adult medical examination without abnormal findings: Secondary | ICD-10-CM

## 2015-04-11 DIAGNOSIS — E785 Hyperlipidemia, unspecified: Secondary | ICD-10-CM

## 2015-04-12 LAB — CBC WITH DIFFERENTIAL/PLATELET
BASOS ABS: 0 10*3/uL (ref 0.0–0.2)
BASOS: 0 %
EOS (ABSOLUTE): 0.1 10*3/uL (ref 0.0–0.4)
Eos: 1 %
Hematocrit: 36.9 % (ref 34.0–46.6)
Hemoglobin: 12.7 g/dL (ref 11.1–15.9)
IMMATURE GRANS (ABS): 0 10*3/uL (ref 0.0–0.1)
Immature Granulocytes: 0 %
LYMPHS ABS: 2.8 10*3/uL (ref 0.7–3.1)
Lymphs: 46 %
MCH: 31.3 pg (ref 26.6–33.0)
MCHC: 34.4 g/dL (ref 31.5–35.7)
MCV: 91 fL (ref 79–97)
MONOS ABS: 0.4 10*3/uL (ref 0.1–0.9)
Monocytes: 7 %
NEUTROS ABS: 2.7 10*3/uL (ref 1.4–7.0)
Neutrophils: 46 %
PLATELETS: 351 10*3/uL (ref 150–379)
RBC: 4.06 x10E6/uL (ref 3.77–5.28)
RDW: 14 % (ref 12.3–15.4)
WBC: 6 10*3/uL (ref 3.4–10.8)

## 2015-04-12 LAB — LIPID PANEL
CHOLESTEROL TOTAL: 224 mg/dL — AB (ref 100–199)
Chol/HDL Ratio: 3.7 ratio units (ref 0.0–4.4)
HDL: 60 mg/dL (ref >39)
LDL Calculated: 142 mg/dL — ABNORMAL HIGH (ref 0–99)
TRIGLYCERIDES: 111 mg/dL (ref 0–149)
VLDL CHOLESTEROL CAL: 22 mg/dL (ref 5–40)

## 2015-04-12 LAB — COMPREHENSIVE METABOLIC PANEL
A/G RATIO: 1.4 (ref 1.1–2.5)
ALK PHOS: 36 IU/L — AB (ref 39–117)
ALT: 18 IU/L (ref 0–32)
AST: 19 IU/L (ref 0–40)
Albumin: 4.2 g/dL (ref 3.5–5.5)
BILIRUBIN TOTAL: 0.6 mg/dL (ref 0.0–1.2)
BUN/Creatinine Ratio: 10 (ref 8–20)
BUN: 8 mg/dL (ref 6–20)
CHLORIDE: 103 mmol/L (ref 96–106)
CO2: 21 mmol/L (ref 18–29)
Calcium: 8.9 mg/dL (ref 8.7–10.2)
Creatinine, Ser: 0.8 mg/dL (ref 0.57–1.00)
GFR calc Af Amer: 117 mL/min/{1.73_m2} (ref >59)
GFR calc non Af Amer: 101 mL/min/{1.73_m2} (ref >59)
GLUCOSE: 78 mg/dL (ref 65–99)
Globulin, Total: 3.1 g/dL (ref 1.5–4.5)
POTASSIUM: 4.4 mmol/L (ref 3.5–5.2)
Sodium: 140 mmol/L (ref 134–144)
TOTAL PROTEIN: 7.3 g/dL (ref 6.0–8.5)

## 2015-04-24 ENCOUNTER — Other Ambulatory Visit: Payer: Self-pay

## 2015-04-24 DIAGNOSIS — E7800 Pure hypercholesterolemia, unspecified: Secondary | ICD-10-CM

## 2015-07-04 ENCOUNTER — Other Ambulatory Visit: Payer: BLUE CROSS/BLUE SHIELD

## 2015-07-04 DIAGNOSIS — E7800 Pure hypercholesterolemia, unspecified: Secondary | ICD-10-CM

## 2015-07-05 ENCOUNTER — Encounter: Payer: Self-pay | Admitting: *Deleted

## 2015-07-05 LAB — LIPID PANEL
CHOLESTEROL TOTAL: 198 mg/dL (ref 100–199)
Chol/HDL Ratio: 3.1 ratio units (ref 0.0–4.4)
HDL: 63 mg/dL (ref >39)
LDL Calculated: 106 mg/dL — ABNORMAL HIGH (ref 0–99)
TRIGLYCERIDES: 147 mg/dL (ref 0–149)
VLDL Cholesterol Cal: 29 mg/dL (ref 5–40)

## 2015-07-06 ENCOUNTER — Encounter: Payer: Self-pay | Admitting: Internal Medicine

## 2015-07-06 ENCOUNTER — Ambulatory Visit (INDEPENDENT_AMBULATORY_CARE_PROVIDER_SITE_OTHER): Payer: BLUE CROSS/BLUE SHIELD | Admitting: Internal Medicine

## 2015-07-06 VITALS — BP 120/82 | HR 112 | Temp 98.3°F | Resp 20 | Ht 66.0 in | Wt 179.8 lb

## 2015-07-06 DIAGNOSIS — E0590 Thyrotoxicosis, unspecified without thyrotoxic crisis or storm: Secondary | ICD-10-CM

## 2015-07-06 DIAGNOSIS — H6691 Otitis media, unspecified, right ear: Secondary | ICD-10-CM

## 2015-07-06 DIAGNOSIS — Z3041 Encounter for surveillance of contraceptive pills: Secondary | ICD-10-CM

## 2015-07-06 DIAGNOSIS — J301 Allergic rhinitis due to pollen: Secondary | ICD-10-CM

## 2015-07-06 LAB — POCT INFLUENZA A/B
INFLUENZA B, POC: NEGATIVE
Influenza A, POC: NEGATIVE

## 2015-07-06 MED ORDER — CEFUROXIME AXETIL 500 MG PO TABS: 500.0000 mg | ORAL_TABLET | Freq: Two times a day (BID) | ORAL | Status: DC

## 2015-07-06 MED ORDER — METHYLPREDNISOLONE ACETATE 40 MG/ML IJ SUSP: 40.0000 mg | Freq: Once | INTRAMUSCULAR | Status: DC

## 2015-07-06 NOTE — Patient Instructions (Addendum)
Push fluids and rest  Depo-medrol injection given today  Flu swab done today NEGATIVE  Recommend OTC plain claritin, allegra or zyrtec daily for seasonal allergy  Use saline nasal spray as needed to keep nose moist  Recommend nasal steroid (eg, Flonase or Nasacort) as directed daily  Eat yogurt daily while on antibiotic  TAKE ALL OF ANTIBIOTIC X 10 DAYS  Return to office if no better in 48-72 hrs. Go to the ER if symptoms worsen

## 2015-07-06 NOTE — Addendum Note (Signed)
Addended by: Lamont SnowballICE, SHARON L on: 07/06/2015 03:34 PM   Modules accepted: Orders

## 2015-07-06 NOTE — Progress Notes (Signed)
Patient ID: Mariah Rocha, female   DOB: 02-29-1986, 30 y.o.   MRN: [id number]    Location:    [facility]    Place of Service:  OFFICE   Chief Complaint  Patient presents with  . Acute Visit    flu-like symtoms all week    HPI:  30 yo female seen today for URI sx's x 1 week. She reports Cp with cough, SOB, dry harsh cough, HA, dizziness, sore throat, sinus pressure, right otalgia, nasal congestion and rhinorrhea, body aches, N/V after cough, hoarseness, reduced appetite, and interruption of sleep. Tried OTC alka seltzer plus/mucinex but no help. Denies sick contacts.  Hyperthyroid - HR elevated today. She takes tapazole. Followed by Endo (Dr Romero Belling)  Migraine HA - has daily HA. She was seeing HA specialist but stopped going as injections were not helping. Takes excedrin migraine  FDLMP in Jan 2017. OCP use produces seasonal menses (q20mos). She is sexually active and uses OCP as Rx daily   Past Medical History  Diagnosis Date  . Allergic rhinitis   . Lower back pain     Past Surgical History  Procedure Laterality Date  . Hernia repair  1989     Dr.Kathleen Samuel Bouche     Patient Care Team: Sharon Seller, NP as PCP - General (Geriatric Medicine) Oneal Grout, MD as Consulting Physician (Internal Medicine)  Social History   Social History  . Marital Status: Single    Spouse Name: N/A  . Number of Children: N/A  . Years of Education: N/A   Occupational History  . Not on file.   Social History Main Topics  . Smoking status: Never Smoker   . Smokeless tobacco: Never Used  . Alcohol Use: 0.0 oz/week    0 Standard drinks or equivalent per week     Comment: once a week  . Drug Use: No  . Sexual Activity: Yes    Birth Control/ Protection: Pill   Other Topics Concern  . Not on file   Social History Narrative   GYN- Dr.Beth Maurice March    ENT- Dr. Flo Shanks     reports that she has never smoked. She has never used smokeless tobacco. She reports that she drinks alcohol.  She reports that she does not use illicit drugs.  No Known Allergies  Medications: Patient's Medications  New Prescriptions   No medications on file  Previous Medications   BACLOFEN (LIORESAL) 10 MG TABLET    Take 10 mg by mouth as needed. Reported on 07/06/2015   LEVONORGESTREL-ETHINYL ESTRADIOL (SETLAKIN) 0.15-0.03 MG TABLET    Take 1 tablet by mouth daily.   METHIMAZOLE (TAPAZOLE) 5 MG TABLET    Take 5 mg by mouth daily. With food   SUMATRIPTAN (IMITREX) 50 MG TABLET    Take one tablet once a day as needed or headache. May repeat in 2 hours if headache persists or recurs. Do not exceed 100 mg in a  day  Modified Medications   No medications on file  Discontinued Medications   ZONISAMIDE (ZONEGRAN) 25 MG CAPSULE    Take 25 mg by mouth at bedtime.    Review of Systems  Constitutional: Positive for appetite change and fatigue. Negative for fever and chills.  HENT: Positive for congestion, ear pain, facial swelling, postnasal drip, rhinorrhea, sore throat and voice change.   Respiratory: Positive for cough and shortness of breath.   Cardiovascular: Positive for chest pain.  Gastrointestinal: Positive for nausea and vomiting. Negative for diarrhea.  Neurological:  Positive for dizziness and headaches.  Psychiatric/Behavioral: Positive for sleep disturbance.  All other systems reviewed and are negative.   Filed Vitals:   07/06/15 1031  BP: 120/82  Pulse: 112  Temp: 98.3 F (36.8 C)  TempSrc: Oral  Resp: 20  Height: 5\' 6"  (1.676 m)  Weight: 179 lb 12.8 oz (81.557 kg)  SpO2: 98%   Body mass index is 29.03 kg/(m^2).  Physical Exam  Constitutional: She is oriented to person, place, and time. She appears well-developed and well-nourished. She appears ill. No distress.  HENT:  Right TM red, bulging but intact. Left TM dull but no redness, intact. Maxillary/sinus TTP with boggy tissue texture changes. Nares with enlarged grey dry turbinates. Oropharynx cobblestoning and redness but  no exudate. Voice is strained  Eyes: Pupils are equal, round, and reactive to light. No scleral icterus.  Neck: Neck supple. Thyromegaly (b/l) present.  Cardiovascular: Normal rate, regular rhythm, normal heart sounds and intact distal pulses.  Exam reveals no gallop and no friction rub.   No murmur heard. HR 84  Pulmonary/Chest: Effort normal and breath sounds normal. No respiratory distress. She has no wheezes. She has no rales. She exhibits no tenderness.  Abdominal: Soft. Bowel sounds are normal. She exhibits no distension and no mass. There is no tenderness. There is no rebound and no guarding.  Lymphadenopathy:    She has cervical adenopathy.  Neurological: She is alert and oriented to person, place, and time.  Skin: Skin is warm and dry. No rash noted.  Psychiatric: She has a normal mood and affect. Her behavior is normal. Judgment and thought content normal.     Labs reviewed: Appointment on 07/04/2015  Component Date Value Ref Range Status  . Cholesterol, Total 07/04/2015 198  100 - 199 mg/dL Final  . Triglycerides 07/04/2015 147  0 - 149 mg/dL Final  . HDL 40/34/742504/07/2015 63  >39 mg/dL Final  . VLDL Cholesterol Cal 07/04/2015 29  5 - 40 mg/dL Final  . LDL Calculated 07/04/2015 956106* 0 - 99 mg/dL Final  . Chol/HDL Ratio 07/04/2015 3.1  0.0 - 4.4 ratio units Final   Comment:                                   T. Chol/HDL Ratio                                             Men  Women                               1/2 Avg.Risk  3.4    3.3                                   Avg.Risk  5.0    4.4                                2X Avg.Risk  9.6    7.1  3X Avg.Risk 23.4   11.0   Appointment on 04/11/2015  Component Date Value Ref Range Status  . Cholesterol, Total 04/11/2015 224* 100 - 199 mg/dL Final  . Triglycerides 04/11/2015 111  0 - 149 mg/dL Final  . HDL 40/98/1191 60  >39 mg/dL Final  . VLDL Cholesterol Cal 04/11/2015 22  5 - 40 mg/dL Final  . LDL  Calculated 04/11/2015 142* 0 - 99 mg/dL Final  . Chol/HDL Ratio 04/11/2015 3.7  0.0 - 4.4 ratio units Final   Comment:                                   T. Chol/HDL Ratio                                             Men  Women                               1/2 Avg.Risk  3.4    3.3                                   Avg.Risk  5.0    4.4                                2X Avg.Risk  9.6    7.1                                3X Avg.Risk 23.4   11.0   . WBC 04/11/2015 6.0  3.4 - 10.8 x10E3/uL Final  . RBC 04/11/2015 4.06  3.77 - 5.28 x10E6/uL Final  . Hemoglobin 04/11/2015 12.7  11.1 - 15.9 g/dL Final  . Hematocrit 47/82/9562 36.9  34.0 - 46.6 % Final  . MCV 04/11/2015 91  79 - 97 fL Final  . MCH 04/11/2015 31.3  26.6 - 33.0 pg Final  . MCHC 04/11/2015 34.4  31.5 - 35.7 g/dL Final  . RDW 13/10/6576 14.0  12.3 - 15.4 % Final  . Platelets 04/11/2015 351  150 - 379 x10E3/uL Final  . Neutrophils 04/11/2015 46   Final  . Lymphs 04/11/2015 46   Final  . Monocytes 04/11/2015 7   Final  . Eos 04/11/2015 1   Final  . Basos 04/11/2015 0   Final  . Neutrophils Absolute 04/11/2015 2.7  1.4 - 7.0 x10E3/uL Final  . Lymphocytes Absolute 04/11/2015 2.8  0.7 - 3.1 x10E3/uL Final  . Monocytes Absolute 04/11/2015 0.4  0.1 - 0.9 x10E3/uL Final  . EOS (ABSOLUTE) 04/11/2015 0.1  0.0 - 0.4 x10E3/uL Final  . Basophils Absolute 04/11/2015 0.0  0.0 - 0.2 x10E3/uL Final  . Immature Granulocytes 04/11/2015 0   Final  . Immature Grans (Abs) 04/11/2015 0.0  0.0 - 0.1 x10E3/uL Final  . Glucose 04/11/2015 78  65 - 99 mg/dL Final  . BUN 46/96/2952 8  6 - 20 mg/dL Final  . Creatinine, Ser 04/11/2015 0.80  0.57 - 1.00 mg/dL Final  . GFR calc non Af Amer 04/11/2015 101  >59 mL/min/1.73 Final  .  GFR calc Af Amer 04/11/2015 117  >59 mL/min/1.73 Final  . BUN/Creatinine Ratio 04/11/2015 10  8 - 20 Final  . Sodium 04/11/2015 140  134 - 144 mmol/L Final  . Potassium 04/11/2015 4.4  3.5 - 5.2 mmol/L Final  . Chloride 04/11/2015  103  96 - 106 mmol/L Final  . CO2 04/11/2015 21  18 - 29 mmol/L Final  . Calcium 04/11/2015 8.9  8.7 - 10.2 mg/dL Final  . Total Protein 04/11/2015 7.3  6.0 - 8.5 g/dL Final  . Albumin 78/46/9629 4.2  3.5 - 5.5 g/dL Final  . Globulin, Total 04/11/2015 3.1  1.5 - 4.5 g/dL Final  . Albumin/Globulin Ratio 04/11/2015 1.4  1.1 - 2.5 Final  . Bilirubin Total 04/11/2015 0.6  0.0 - 1.2 mg/dL Final  . Alkaline Phosphatase 04/11/2015 36* 39 - 117 IU/L Final  . AST 04/11/2015 19  0 - 40 IU/L Final  . ALT 04/11/2015 18  0 - 32 IU/L Final    No results found.   Assessment/Plan   ICD-9-CM ICD-10-CM   1. Acute right otitis media, recurrence not specified, unspecified otitis media type 382.9 H66.91 POC Influenza A/B  2. Allergic rhinitis due to pollen 477.0 J30.1 POC Influenza A/B  3. Hyperthyroidism 242.90 E05.90   4. Oral contraceptive use V25.41 Z30.41    Push fluids and rest  Depo-medrol  IM given today  Flu swab done today NEGATIVE  Recommend OTC plain claritin, allegra or zyrtec daily for seasonal allergy  Use saline nasal spray as needed to keep nose moist  Recommend nasal steroid (eg, Flonase or Nasacort) as directed daily  Eat yogurt daily while on antibiotic  TAKE ALL OF ANTIBIOTIC X 10 DAYS  Return to office if no better in 48-72 hrs. Go to the ER if symptoms worsen  F/u as scheduled  [place] S. Ancil Linsey  [facility] [address] [place], [place] [address] [phone] Cell (Monday-Friday 8 AM - 5 PM) [phone] After 5 PM and follow prompts

## 2015-12-30 LAB — HM PAP SMEAR

## 2016-04-03 ENCOUNTER — Ambulatory Visit (INDEPENDENT_AMBULATORY_CARE_PROVIDER_SITE_OTHER): Payer: BLUE CROSS/BLUE SHIELD | Admitting: Nurse Practitioner

## 2016-04-03 ENCOUNTER — Encounter: Payer: Self-pay | Admitting: Nurse Practitioner

## 2016-04-03 VITALS — BP 118/72 | HR 94 | Temp 98.1°F | Resp 17 | Ht 66.0 in | Wt 192.2 lb

## 2016-04-03 DIAGNOSIS — G8929 Other chronic pain: Secondary | ICD-10-CM | POA: Diagnosis not present

## 2016-04-03 DIAGNOSIS — J302 Other seasonal allergic rhinitis: Secondary | ICD-10-CM | POA: Diagnosis not present

## 2016-04-03 DIAGNOSIS — Z23 Encounter for immunization: Secondary | ICD-10-CM | POA: Diagnosis not present

## 2016-04-03 DIAGNOSIS — K5900 Constipation, unspecified: Secondary | ICD-10-CM | POA: Diagnosis not present

## 2016-04-03 DIAGNOSIS — M545 Low back pain, unspecified: Secondary | ICD-10-CM

## 2016-04-03 DIAGNOSIS — G43909 Migraine, unspecified, not intractable, without status migrainosus: Secondary | ICD-10-CM | POA: Diagnosis not present

## 2016-04-03 DIAGNOSIS — E785 Hyperlipidemia, unspecified: Secondary | ICD-10-CM

## 2016-04-03 DIAGNOSIS — E0590 Thyrotoxicosis, unspecified without thyrotoxic crisis or storm: Secondary | ICD-10-CM | POA: Diagnosis not present

## 2016-04-03 MED ORDER — PROPRANOLOL HCL 20 MG PO TABS: 20.0000 mg | ORAL_TABLET | Freq: Three times a day (TID) | ORAL | 0 refills | Status: DC

## 2016-04-03 NOTE — Patient Instructions (Addendum)
Check your blood pressure and heart rate a few times a week. Keep a log and bring it with you to your next appointment.  If you start getting dizzy, fatigued, or having weird side effects please call and notify so that changes can be made to your medication.   Start propranolol one tablet a day. If you continue to have headaches, increase the medication to twice a day, can go up to three times daily   Return in 2 weeks for a follow-up on headaches and lab work.   Exercise 30 minutes 5 days a week.    DASH Eating Plan DASH stands for "Dietary Approaches to Stop Hypertension." The DASH eating plan is a healthy eating plan that has been shown to reduce high blood pressure (hypertension). Additional health benefits may include reducing the risk of type 2 diabetes mellitus, heart disease, and stroke. The DASH eating plan may also help with weight loss. What do I need to know about the DASH eating plan? For the DASH eating plan, you will follow these general guidelines:  Choose foods with less than 150 milligrams of sodium per serving (as listed on the food label).  Use salt-free seasonings or herbs instead of table salt or sea salt.  Check with your health care provider or pharmacist before using salt substitutes.  Eat lower-sodium products. These are often labeled as "low-sodium" or "no salt added."  Eat fresh foods. Avoid eating a lot of canned foods.  Eat more vegetables, fruits, and low-fat dairy products.  Choose whole grains. Look for the word "whole" as the first word in the ingredient list.  Choose fish and skinless chicken or [place] more often than red meat. Limit fish, poultry, and meat to 6 oz (170 g) each day.  Limit sweets, desserts, sugars, and sugary drinks.  Choose heart-healthy fats.  Eat more home-cooked food and less restaurant, buffet, and fast food.  Limit fried foods.  Do not fry foods. Cook foods using methods such as baking, boiling, grilling, and broiling  instead.  When eating at a restaurant, ask that your food be prepared with less salt, or no salt if possible. What foods can I eat? Seek help from a dietitian for individual calorie needs. Grains  Whole grain or whole wheat bread. Brown rice. Whole grain or whole wheat pasta. Quinoa, bulgur, and whole grain cereals. Low-sodium cereals. Corn or whole wheat flour tortillas. Whole grain cornbread. Whole grain crackers. Low-sodium crackers. Vegetables  Fresh or frozen vegetables (raw, steamed, roasted, or grilled). Low-sodium or reduced-sodium tomato and vegetable juices. Low-sodium or reduced-sodium tomato sauce and paste. Low-sodium or reduced-sodium canned vegetables. Fruits  All fresh, canned (in natural juice), or frozen fruits. Meat and Other Protein Products  Ground beef (85% or leaner), grass-fed beef, or beef trimmed of fat. Skinless chicken or [place]. Ground chicken or [place]. Pork trimmed of fat. All fish and seafood. Eggs. Dried beans, peas, or lentils. Unsalted nuts and seeds. Unsalted canned beans. Dairy  Low-fat dairy products, such as skim or 1% milk, 2% or reduced-fat cheeses, low-fat ricotta or cottage cheese, or plain low-fat yogurt. Low-sodium or reduced-sodium cheeses. Fats and Oils  Tub margarines without trans fats. Light or reduced-fat mayonnaise and salad dressings (reduced sodium). Avocado. Safflower, olive, or canola oils. Natural peanut or almond butter. Other  Unsalted popcorn and pretzels. The items listed above may not be a complete list of recommended foods or beverages. Contact your dietitian for more options.  What foods are not recommended? Grains  White bread. White pasta. White rice. Refined cornbread. Bagels and croissants. Crackers that contain trans fat. Vegetables  Creamed or fried vegetables. Vegetables in a cheese sauce. Regular canned vegetables. Regular canned tomato sauce and paste. Regular tomato and vegetable juices. Fruits  Canned fruit in light  or heavy syrup. Fruit juice. Meat and Other Protein Products  Fatty cuts of meat. Ribs, chicken wings, bacon, sausage, bologna, salami, chitterlings, fatback, hot dogs, bratwurst, and packaged luncheon meats. Salted nuts and seeds. Canned beans with salt. Dairy  Whole or 2% milk, cream, half-and-half, and cream cheese. Whole-fat or sweetened yogurt. Full-fat cheeses or blue cheese. Nondairy creamers and whipped toppings. Processed cheese, cheese spreads, or cheese curds. Condiments  Onion and garlic salt, seasoned salt, table salt, and sea salt. Canned and packaged gravies. Worcestershire sauce. Tartar sauce. Barbecue sauce. Teriyaki sauce. Soy sauce, including reduced sodium. Steak sauce. Fish sauce. Oyster sauce. Cocktail sauce. Horseradish. Ketchup and mustard. Meat flavorings and tenderizers. Bouillon cubes. Hot sauce. Tabasco sauce. Marinades. Taco seasonings. Relishes. Fats and Oils  Butter, stick margarine, lard, shortening, ghee, and bacon fat. Coconut, palm kernel, or palm oils. Regular salad dressings. Other  Pickles and olives. Salted popcorn and pretzels. The items listed above may not be a complete list of foods and beverages to avoid. Contact your dietitian for more information.  Where can I find more information? National Heart, Lung, and Blood Institute: [employer] This information is not intended to replace advice given to you by your health care provider. Make sure you discuss any questions you have with your health care provider. Document Released: 03/06/2011 Document Revised: 08/23/2015 Document Reviewed: 01/19/2013 Elsevier Interactive Patient Education  2017 [employer].

## 2016-04-03 NOTE — Progress Notes (Signed)
Careteam: Patient Care Team: Sharon SellerJessica K Franshesca Chipman, NP as PCP - General (Geriatric Medicine) Oneal GroutMahima Pandey, MD as Consulting Physician (Internal Medicine)  Advanced Directive information Does Patient Have a Medical Advance Directive?: No  No Known Allergies  Chief Complaint  Patient presents with  . Medical Management of Chronic Issues    1 year follow up. Pt wants flu vaccine.     HPI: Patient is a 31 y.o. female seen in the office today for a routine follow-up. Patient has a history of temporal headaches, hyperthyroidism, constipation, hyperlipidemia, low back pain, and allergic rhinitis.   Follows with endocrinologist for hyperthyroidism. Last saw in November 2017 - changed Tapazole to 4x/week. Returns in March for follow up.  Migraine headaches - was going to a headache specialist but the medication and shots were not effective so she quit going. At this time she takes Excedrin for headaches which is effective at times. At this time has a headache at least every other day for the past 2-3 months. Headaches either begin in the morning or mid-day. If they start mid-day, they last through the night and into the next day. Imitrex was not effective at relieving headaches.   Constipation resolved.   Occasional low back pain; nothing significant.  Allergic rhinitis which she reports is nothing more than seasonal allergies.  Had PAP this past summer.  Takes Setlakin for birth control. Is sexually active.   Denies anxiety and depression.  Does not eat any kind of special diet or exercise.   Review of Systems:  Review of Systems  Constitutional: Negative for activity change, appetite change, fatigue and unexpected weight change.  HENT: Negative for congestion and hearing loss.   Eyes: Negative.   Respiratory: Negative for cough and shortness of breath.   Cardiovascular: Negative for chest pain, palpitations and leg swelling.  Gastrointestinal: Negative for abdominal pain,  constipation and diarrhea.  Genitourinary: Negative for difficulty urinating and dysuria.  Musculoskeletal: Negative for arthralgias and myalgias.  Skin: Negative for color change and wound.  Neurological: Positive for headaches. Negative for dizziness and weakness.  Psychiatric/Behavioral: Negative for agitation, behavioral problems and confusion.    Past Medical History:  Diagnosis Date  . Allergic rhinitis   . Lower back pain    Past Surgical History:  Procedure Laterality Date  . HERNIA REPAIR  1989    Dr.Kathleen Samuel BoucheLucas    Social History:   reports that she has never smoked. She has never used smokeless tobacco. She reports that she drinks alcohol. She reports that she does not use drugs.  Family History  Problem Relation Age of Onset  . Breast cancer Maternal Aunt   . Hypertension Mother     Medications: Patient's Medications  New Prescriptions   PROPRANOLOL (INDERAL) 20 MG TABLET    Take 1 tablet (20 mg total) by mouth 3 (three) times daily.  Previous Medications   LEVONORGESTREL-ETHINYL ESTRADIOL (SETLAKIN) 0.15-0.03 MG TABLET    Take 1 tablet by mouth daily.   METHIMAZOLE (TAPAZOLE) 5 MG TABLET    Take 5 mg by mouth 4 (four) times a week. With food   Modified Medications   No medications on file  Discontinued Medications   BACLOFEN (LIORESAL) 10 MG TABLET    Take 10 mg by mouth as needed. Reported on 07/06/2015   CEFUROXIME (CEFTIN) 500 MG TABLET    Take 1 tablet (500 mg total) by mouth 2 (two) times daily with a meal.   SUMATRIPTAN (IMITREX) 50 MG TABLET  Take one tablet once a day as needed or headache. May repeat in 2 hours if headache persists or recurs. Do not exceed 100 mg in a  day     Physical Exam:  Vitals:   04/03/16 1301  BP: 118/72  Pulse: 94  Resp: 17  Temp: 98.1 F (36.7 C)  TempSrc: Oral  SpO2: 97%  Weight: 192 lb 3.2 oz (87.2 kg)  Height: 5\' 6"  (1.676 m)   Body mass index is 31.02 kg/m.  Physical Exam  Constitutional: She is  oriented to person, place, and time. She appears well-developed and well-nourished. No distress.  HENT:  Head: Normocephalic and atraumatic.  Mouth/Throat: No oropharyngeal exudate.  Eyes: Conjunctivae are normal. Pupils are equal, round, and reactive to light.  Neck: Normal range of motion. Neck supple. No thyromegaly present.  Cardiovascular: Normal rate, regular rhythm and normal heart sounds.   Pulmonary/Chest: Effort normal and breath sounds normal.  Abdominal: Soft. Bowel sounds are normal.  Genitourinary:  Genitourinary Comments: Completed by OB-GYN  Musculoskeletal: Normal range of motion. She exhibits no edema or tenderness.  Neurological: She is alert and oriented to person, place, and time.  Skin: Skin is warm and dry. She is not diaphoretic.  Psychiatric: She has a normal mood and affect. Her behavior is normal. Judgment and thought content normal.    Labs reviewed: Basic Metabolic Panel:  Recent Labs  40/98/11 1012  NA 140  K 4.4  CL 103  CO2 21  GLUCOSE 78  BUN 8  CREATININE 0.80  CALCIUM 8.9   Liver Function Tests:  Recent Labs  04/11/15 1012  AST 19  ALT 18  ALKPHOS 36*  BILITOT 0.6  PROT 7.3  ALBUMIN 4.2   No results for input(s): LIPASE, AMYLASE in the last 8760 hours. No results for input(s): AMMONIA in the last 8760 hours. CBC:  Recent Labs  04/11/15 1012  WBC 6.0  NEUTROABS 2.7  HCT 36.9  MCV 91  PLT 351   Lipid Panel:  Recent Labs  04/11/15 1012 07/04/15 0830  CHOL 224* 198  HDL 60 63  LDLCALC 142* 106*  TRIG 111 147  CHOLHDL 3.7 3.1   TSH: No results for input(s): TSH in the last 8760 hours. A1C: No results found for: HGBA1C   Assessment/Plan 1. Migraine without status migrainosus, not intractable, unspecified migraine type - Was going to a headache specialist who was prescribing her medications and giving injections; the patient quit going as she did not feel as if any of the treatment was effective and due to  coset - propranolol (INDERAL) 20 MG tablet; Take 1 tablet (20 mg total) by mouth 3 (three) times daily.  Dispense: 90 tablet; Refill: 0 - Advised to start the propranolol 20 mg once a day and increase gradually while monitoring her HR and BP - to keep a log and bring to next appointment. - Educated on side effects of propranolol and to call office if experienced so that her medication can be adjusted.   2. Need for immunization against influenza - Flu Vaccine QUAD 36+ mos PF IM (Fluarix & Fluzone Quad PF)  3. Hyperlipidemia, unspecified hyperlipidemia type - Diet and exercise encouraged; education provided on dietary changes to be made - Will obtain lipid panel at follow-up in 2 weeks  4. Hyperthyroidism - Managed by endocrinologist; last saw in November when she decreased her Tapazole from daily to 4x/week.  - Keep scheduled follow-up in March 2018.   5. Constipation, unspecified constipation type -  Improved with increased fluid and fiber intake.  6. Chronic seasonal allergic rhinitis, unspecified trigger - Stable  7. Chronic low back pain without sciatica, unspecified back pain laterality - Has occasional back pain; reports it is nothing significant.   Return in 2 weeks for a follow-up on headaches and lab work for [employer] K. Biagio Borg  [facility] [phone] 8 am - 5 pm) [phone] (after hours)

## 2016-04-17 ENCOUNTER — Ambulatory Visit: Payer: BLUE CROSS/BLUE SHIELD | Admitting: Nurse Practitioner

## 2016-04-29 ENCOUNTER — Ambulatory Visit: Payer: BLUE CROSS/BLUE SHIELD | Admitting: Nurse Practitioner

## 2016-04-30 ENCOUNTER — Ambulatory Visit (INDEPENDENT_AMBULATORY_CARE_PROVIDER_SITE_OTHER): Payer: BLUE CROSS/BLUE SHIELD | Admitting: Internal Medicine

## 2016-04-30 ENCOUNTER — Encounter: Payer: Self-pay | Admitting: Internal Medicine

## 2016-04-30 VITALS — BP 174/110 | HR 109 | Temp 98.1°F | Ht 66.0 in | Wt 191.8 lb

## 2016-04-30 DIAGNOSIS — R5383 Other fatigue: Secondary | ICD-10-CM

## 2016-04-30 DIAGNOSIS — E0590 Thyrotoxicosis, unspecified without thyrotoxic crisis or storm: Secondary | ICD-10-CM | POA: Diagnosis not present

## 2016-04-30 DIAGNOSIS — Z3041 Encounter for surveillance of contraceptive pills: Secondary | ICD-10-CM | POA: Diagnosis not present

## 2016-04-30 DIAGNOSIS — G43909 Migraine, unspecified, not intractable, without status migrainosus: Secondary | ICD-10-CM

## 2016-04-30 DIAGNOSIS — J302 Other seasonal allergic rhinitis: Secondary | ICD-10-CM | POA: Diagnosis not present

## 2016-04-30 DIAGNOSIS — R030 Elevated blood-pressure reading, without diagnosis of hypertension: Secondary | ICD-10-CM

## 2016-04-30 LAB — URINALYSIS, ROUTINE W REFLEX MICROSCOPIC
Bilirubin Urine: NEGATIVE
Glucose, UA: NEGATIVE
Ketones, ur: NEGATIVE
Leukocytes, UA: NEGATIVE
NITRITE: NEGATIVE
Specific Gravity, Urine: 1.02 (ref 1.001–1.035)
pH: 6 (ref 5.0–8.0)

## 2016-04-30 LAB — CBC WITH DIFFERENTIAL/PLATELET
BASOS PCT: 0 %
Basophils Absolute: 0 cells/uL (ref 0–200)
Eosinophils Absolute: 61 cells/uL (ref 15–500)
Eosinophils Relative: 1 %
HCT: 39.2 % (ref 35.0–45.0)
Hemoglobin: 13.2 g/dL (ref 11.7–15.5)
LYMPHS PCT: 40 %
Lymphs Abs: 2440 cells/uL (ref 850–3900)
MCH: 30.5 pg (ref 27.0–33.0)
MCHC: 33.7 g/dL (ref 32.0–36.0)
MCV: 90.5 fL (ref 80.0–100.0)
MPV: 10.1 fL (ref 7.5–12.5)
Monocytes Absolute: 366 cells/uL (ref 200–950)
Monocytes Relative: 6 %
NEUTROS PCT: 53 %
Neutro Abs: 3233 cells/uL (ref 1500–7800)
PLATELETS: 357 10*3/uL (ref 140–400)
RBC: 4.33 MIL/uL (ref 3.80–5.10)
RDW: 13.5 % (ref 11.0–15.0)
WBC: 6.1 10*3/uL (ref 3.8–10.8)

## 2016-04-30 LAB — TSH: TSH: 1.29 mIU/L

## 2016-04-30 LAB — T4, FREE: FREE T4: 1 ng/dL (ref 0.8–1.8)

## 2016-04-30 MED ORDER — KETOROLAC TROMETHAMINE 60 MG/2ML IM SOLN
60.0000 mg | Freq: Once | INTRAMUSCULAR | Status: AC
  Administered 2016-04-30: 60 mg via INTRAMUSCULAR

## 2016-04-30 MED ORDER — BUTORPHANOL TARTRATE 10 MG/ML NA SOLN: 1.0000 | NASAL | 0 refills | Status: DC | PRN

## 2016-04-30 MED ORDER — TOPIRAMATE 25 MG PO TABS: 25.0000 mg | ORAL_TABLET | Freq: Every day | ORAL | 6 refills | Status: DC

## 2016-04-30 NOTE — Patient Instructions (Addendum)
START TOPAMAX for migraine headache. Take 1 tab daily x 7 days then increase to 2 tabs daily x 7 days -->3 tabs daily x 7 days-->4 tabs daily   START OTC PLAIN CLARITIN, ALLEGRA OR ZYRTEC DAILY for seasonal allergy  Use STADOL as needed for severe migraine headache  Continue other medications as ordered  Follow up with Shanda BumpsJessica or Dr Montez Moritaarter in 4 weeks to re-evaluate headache.  Will call with results  Toradol 60mg  injection given today

## 2016-04-30 NOTE — Progress Notes (Signed)
Patient ID: Mariah Rocha, female   DOB: [birth year], 31 y.o.   MRN: [id number]    Location:  [facility] Place of Service: OFFICE  Chief Complaint  Patient presents with  . Follow-up    headaches    HPI:  31 yo female seen today for f/u migraine HA. She was started on propanolol at last OV. She was taking up to 52m TID without benefit. She is missing work. She has photosensitivity and sound sensitivity. She has dizziness, fatigue, increased sleeping, loss of appetite, nausea and feels lightheaded. Pain is a 10/10 on scale. She has tried trigger point injections in the past. She has never seen a neurologist. She has never had imaging of head/brain. No [facility] She is on season[facility] OCP. She has had HA x several yrs but dx with migraine HA about 4 yrs ago. "loud" cologne or parfume triggers HA. HA throbbing along left temporal and radiates to occiput. Feels like tight hat on head at times. No increased stressors  Hyperthyroidism - followed by Endo. Takes tapazole daily.  Allergic rhinitis - occasionally takes benadryl   Past Medical History:  Diagnosis Date  . Allergic rhinitis   . Lower back pain     Past Surgical History:  Procedure Laterality Date  . HDale    Patient Care Team: JLauree Chandler NP as PCP - General (Geriatric Medicine) MBlanchie Serve MD as Consulting Physician (Internal Medicine)  Social History   Social History  . Marital status: Single    Spouse name: N/A  . Number of children: N/A  . Years of education: N/A   Occupational History  . Not on file.   Social History Main Topics  . Smoking status: Never Smoker  . Smokeless tobacco: Never Used  . Alcohol use 0.0 oz/week     Comment: once a week  . Drug use: No  . Sexual activity: Yes    Birth control/ protection: Pill   Other Topics Concern  . Not on file   Social History Narrative   GYN- [facility]   ENT- Dr. KJodi Marble    reports that she has never smoked. She has never used smokeless tobacco. She reports that she drinks alcohol. She reports that she does not use drugs.  Family History  Problem Relation Age of Onset  . Breast cancer Maternal Aunt   . Hypertension Mother    Family Status  Relation Status  . Maternal Aunt   . Mother      No Known Allergies  Medications: Patient's Medications  New Prescriptions   BUTORPHANOL (STADOL) 10 MG/ML NASAL SPRAY    Place 1 spray into the nose every 4 (four) hours as needed for headache.   TOPIRAMATE (TOPAMAX) 25 MG TABLET    Take 1 tablet (25 mg total) by mouth daily. May  take up to 4 tabs daily per instructions  Previous Medications   LEVONORGESTREL-ETHINYL ESTRADIOL (SETLAKIN) 0.15-0.03 MG TABLET    Take 1 tablet by mouth daily.   METHIMAZOLE (TAPAZOLE) 5 MG TABLET    Take 5 mg by mouth 4 (four) times a week. With food    PROPRANOLOL (INDERAL) 20 MG TABLET    Take 1 tablet (20 mg total) by mouth 3 (three) times daily.  Modified Medications   No medications on file  Discontinued Medications   No medications on file    Review of Systems  Constitutional: Positive  for activity change and fatigue.  Eyes: Positive for photophobia.  Gastrointestinal: Positive for nausea.  Musculoskeletal: Positive for arthralgias.  Neurological: Positive for dizziness, light-headedness and headaches.  Psychiatric/Behavioral: Positive for sleep disturbance.  All other systems reviewed and are negative.   Vitals:   04/30/16 1311 04/30/16 1312  BP: (!) 182/118 (!) 174/110  Pulse: (!) 109   Temp: 98.1 F (36.7 C)   TempSrc: Oral   SpO2: 98%   Weight: 191 lb 12.8 oz (87 kg)   Height: _0  (1.676 m)    Body mass index is 30.96 kg/m.  Physical Exam  Constitutional: She is oriented to person, place, and time. She appears well-developed and well-nourished.  HENT:  Head:    Mouth/Throat: Oropharynx is clear and moist. No oropharyngeal exudate.    Eyes: EOM are normal. Pupils are equal, round, and reactive to light. Right eye exhibits no discharge. Left eye exhibits no discharge. No scleral icterus.  Neck: Neck supple. Carotid bruit is not present. No tracheal deviation present. Thyromegaly (b/l goiter) present.  Cardiovascular: Normal rate, regular rhythm, normal heart sounds and intact distal pulses.  Exam reveals no gallop and no friction rub.   No murmur heard. No LE edema b/l. no calf TTP.   Pulmonary/Chest: Effort normal and breath sounds normal. No stridor. No respiratory distress. She has no wheezes. She has no rales.  Abdominal: Soft. Bowel sounds are normal. She exhibits no distension and no mass. There is no hepatomegaly. There is no tenderness. There is no rebound and no guarding.  Musculoskeletal: She exhibits edema and tenderness.  Lymphadenopathy:    She has no cervical adenopathy.  Neurological: She is alert and oriented to person, place, and time. No cranial nerve deficit.  FROM all extremities  Skin: Skin is warm and dry. No rash noted.  Psychiatric: She has a normal mood and affect. Her behavior is normal. Judgment and thought content normal.     Labs reviewed: No visits with results within 3 Month(s) from this visit.  Latest known visit with results is:  Office Visit on 07/06/2015  Component Date Value Ref Range Status  . Influenza A, POC 07/06/2015 Negative  Negative Final  . Influenza B, POC 07/06/2015 Negative  Negative Final    No results found.   Assessment/Plan   ICD-9-CM ICD-10-CM   1. Migraine without status migrainosus, not intractable, unspecified migraine type 346.90 G43.909 butorphanol (STADOL) 10 MG/ML nasal spray     topiramate (TOPAMAX) 25 MG tablet     CT Head Wo Contrast     T4, Free     TSH     CMP with eGFR     CBC with Differential/Platelets     Pregnancy, urine     Urinalysis with Reflex Microscopic     ketorolac (TORADOL) injection 60 mg  2. Chronic seasonal allergic  rhinitis, unspecified trigger 477.8 J30.2   3. Hyperthyroidism 242.90 E05.90 T4, Free     TSH     Urinalysis with Reflex Microscopic  4. Oral contraceptive use V25.41 Z30.41   5. Other fatigue 780.79 R53.83 T4, Free     TSH     CMP with eGFR     CBC with Differential/Platelets     Pregnancy, urine     Urinalysis with Reflex Microscopic  6. Elevated BP without diagnosis of hypertension 796.2 R03.0    due to #1     T/c neurology eval if not feeling better  START TOPAMAX for migraine headache. Take 1 tab  daily x 7 days then increase to 2 tabs daily x 7 days -->3 tabs daily x 7 days-->4 tabs daily   START OTC PLAIN CLARITIN, ALLEGRA OR ZYRTEC DAILY for seasonal allergy  Use STADOL as needed for severe migraine headache  Continue other medications as ordered  Follow up with Janett Billow or Dr Eulas Post in 4 weeks to re-evaluate headache.  Will call with results  Toradol 13m injection given today  Yaritza Leist S. CPerlie Gold PRutgers Health University [facility] [address]GRocky [address] [place] [phone] (Monday-Friday 8 AM - 5 PM) ([phone] 5 PM and follow prompts

## 2016-05-01 LAB — URINALYSIS, MICROSCOPIC ONLY
CRYSTALS: NONE SEEN [HPF]
Casts: NONE SEEN [LPF]
Yeast: NONE SEEN [HPF]

## 2016-05-01 LAB — COMPLETE METABOLIC PANEL WITH GFR
ALBUMIN: 4.3 g/dL (ref 3.6–5.1)
ALK PHOS: 37 U/L (ref 33–115)
ALT: 13 U/L (ref 6–29)
AST: 17 U/L (ref 10–30)
BUN: 8 mg/dL (ref 7–25)
CO2: 26 mmol/L (ref 20–31)
CREATININE: 0.68 mg/dL (ref 0.50–1.10)
Calcium: 9.1 mg/dL (ref 8.6–10.2)
Chloride: 104 mmol/L (ref 98–110)
GFR, Est African American: >89 mL/min (ref >=60)
GFR, Est Non African American: >89 mL/min (ref >=60)
Glucose, Bld: 75 mg/dL (ref 65–99)
Potassium: 3.9 mmol/L (ref 3.5–5.3)
Sodium: 138 mmol/L (ref 135–146)
Total Bilirubin: 0.6 mg/dL (ref 0.2–1.2)
Total Protein: 7.8 g/dL (ref 6.1–8.1)

## 2016-05-01 LAB — PREGNANCY, URINE: PREG TEST UR: NEGATIVE

## 2016-05-08 ENCOUNTER — Ambulatory Visit
Admission: RE | Admit: 2016-05-08 | Discharge: 2016-05-08 | Disposition: A | Discharge status: Home / Self Care | Payer: BLUE CROSS/BLUE SHIELD | Source: Ambulatory Visit | Attending: Internal Medicine | Admitting: Internal Medicine

## 2016-05-08 DIAGNOSIS — G43909 Migraine, unspecified, not intractable, without status migrainosus: Secondary | ICD-10-CM

## 2016-05-29 ENCOUNTER — Ambulatory Visit (INDEPENDENT_AMBULATORY_CARE_PROVIDER_SITE_OTHER): Payer: BLUE CROSS/BLUE SHIELD | Admitting: Nurse Practitioner

## 2016-05-29 ENCOUNTER — Encounter: Payer: Self-pay | Admitting: Nurse Practitioner

## 2016-05-29 VITALS — BP 148/89 | HR 90 | Temp 98.4°F | Resp 16 | Ht 66.0 in | Wt 189.2 lb

## 2016-05-29 DIAGNOSIS — E785 Hyperlipidemia, unspecified: Secondary | ICD-10-CM

## 2016-05-29 DIAGNOSIS — G43909 Migraine, unspecified, not intractable, without status migrainosus: Secondary | ICD-10-CM

## 2016-05-29 DIAGNOSIS — F419 Anxiety disorder, unspecified: Secondary | ICD-10-CM | POA: Diagnosis not present

## 2016-05-29 DIAGNOSIS — R030 Elevated blood-pressure reading, without diagnosis of hypertension: Secondary | ICD-10-CM | POA: Diagnosis not present

## 2016-05-29 LAB — LIPID PANEL
CHOL/HDL RATIO: 3.5 ratio (ref <5.0)
CHOLESTEROL: 193 mg/dL (ref <200)
HDL: 55 mg/dL (ref >50)
LDL CALC: 121 mg/dL — AB (ref <100)
Triglycerides: 87 mg/dL (ref <150)
VLDL: 17 mg/dL (ref <30)

## 2016-05-29 MED ORDER — DULOXETINE HCL 60 MG PO CPEP: 60.0000 mg | ORAL_CAPSULE | Freq: Every day | ORAL | 0 refills | Status: DC

## 2016-05-29 NOTE — Patient Instructions (Addendum)
[facility]   Address: [address], [place], [place] [address] Phone: [phone]  To start cymbalta 60 mg daily for anxiety  Will refer to neurology due to uncontrolled migraine headache   To take blood pressure after you have been sitting at home relaxed for at least 5 mins    DASH Eating Plan DASH stands for "Dietary Approaches to Stop Hypertension." The DASH eating plan is a healthy eating plan that has been shown to reduce high blood pressure (hypertension). It may also reduce your risk for type 2 diabetes, heart disease, and stroke. The DASH eating plan may also help with weight loss. What are tips for following this plan? General guidelines   Avoid eating more than 2,300 mg (milligrams) of salt (sodium) a day. If you have hypertension, you may need to reduce your sodium intake to 1,500 mg a day.  Limit alcohol intake to no more than 1 drink a day for nonpregnant women and 2 drinks a day for men. One drink equals 12 oz of beer, 5 oz of wine, or 1 oz of hard liquor.  Work with your health care provider to maintain a healthy body weight or to lose weight. Ask what an ideal weight is for you.  Get at least 30 minutes of exercise that causes your heart to beat faster (aerobic exercise) most days of the week. Activities may include walking, swimming, or biking.  Work with your health care provider or diet and nutrition specialist (dietitian) to adjust your eating plan to your individual calorie needs. Reading food labels   Check food labels for the amount of sodium per serving. Choose foods with less than 5 percent of the Daily Value of sodium. Generally, foods with less than 300 mg of sodium per serving fit into this eating plan.  To find whole grains, look for the word "whole" as the first word in the ingredient list. Shopping   Buy products labeled as "low-sodium" or "no salt added."  Buy fresh foods. Avoid canned foods and premade or  frozen meals. Cooking   Avoid adding salt when cooking. Use salt-free seasonings or herbs instead of table salt or sea salt. Check with your health care provider or pharmacist before using salt substitutes.  Do not fry foods. Cook foods using healthy methods such as baking, boiling, grilling, and broiling instead.  Cook with heart-healthy oils, such as olive, canola, soybean, or sunflower oil. Meal planning    Eat a balanced diet that includes:  5 or more servings of fruits and vegetables each day. At each meal, try to fill half of your plate with fruits and vegetables.  Up to 6-8 servings of whole grains each day.  Less than 6 oz of lean meat, poultry, or fish each day. A 3-oz serving of meat is about the same size as a deck of cards. One egg equals 1 oz.  2 servings of low-fat dairy each day.  A serving of nuts, seeds, or beans 5 times each week.  Heart-healthy fats. Healthy fats called Omega-3 fatty acids are found in foods such as flaxseeds and coldwater fish, like sardines, salmon, and mackerel.  Limit how much you eat of the following:  Canned or prepackaged foods.  Food that is high in trans fat, such as fried foods.  Food that is high in saturated fat, such as fatty meat.  Sweets, desserts, sugary drinks, and other foods with added sugar.  Full-fat dairy products.  Do not  salt foods before eating.  Try to eat at least 2 vegetarian meals each week.  Eat more home-cooked food and less restaurant, buffet, and fast food.  When eating at a restaurant, ask that your food be prepared with less salt or no salt, if possible. What foods are recommended? The items listed may not be a complete list. Talk with your dietitian about what dietary choices are best for you. Grains  Whole-grain or whole-wheat bread. Whole-grain or whole-wheat pasta. Brown rice. Orpah Cobb. Bulgur. Whole-grain and low-sodium cereals. Pita bread. Low-fat, low-sodium crackers. Whole-wheat flour  tortillas. Vegetables  Fresh or frozen vegetables (raw, steamed, roasted, or grilled). Low-sodium or reduced-sodium tomato and vegetable juice. Low-sodium or reduced-sodium tomato sauce and tomato paste. Low-sodium or reduced-sodium canned vegetables. Fruits  All fresh, dried, or frozen fruit. Canned fruit in natural juice (without added sugar). Meat and other protein foods  Skinless chicken or [place]. Ground chicken or [place]. Pork with fat trimmed off. Fish and seafood. Egg whites. Dried beans, peas, or lentils. Unsalted nuts, nut butters, and seeds. Unsalted canned beans. Lean cuts of beef with fat trimmed off. Low-sodium, lean deli meat. Dairy  Low-fat (1%) or fat-free (skim) milk. Fat-free, low-fat, or reduced-fat cheeses. Nonfat, low-sodium ricotta or cottage cheese. Low-fat or nonfat yogurt. Low-fat, low-sodium cheese. Fats and oils  Soft margarine without trans fats. Vegetable oil. Low-fat, reduced-fat, or light mayonnaise and salad dressings (reduced-sodium). Canola, safflower, olive, soybean, and sunflower oils. Avocado. Seasoning and other foods  Herbs. Spices. Seasoning mixes without salt. Unsalted popcorn and pretzels. Fat-free sweets. What foods are not recommended? The items listed may not be a complete list. Talk with your dietitian about what dietary choices are best for you. Grains  Baked goods made with fat, such as croissants, muffins, or some breads. Dry pasta or rice meal packs. Vegetables  Creamed or fried vegetables. Vegetables in a cheese sauce. Regular canned vegetables (not low-sodium or reduced-sodium). Regular canned tomato sauce and paste (not low-sodium or reduced-sodium). Regular tomato and vegetable juice (not low-sodium or reduced-sodium). Rosita Fire. Olives. Fruits  Canned fruit in a light or heavy syrup. Fried fruit. Fruit in cream or butter sauce. Meat and other protein foods  Fatty cuts of meat. Ribs. Fried meat. Tomasa Blase. Sausage. Bologna and other processed  lunch meats. Salami. Fatback. Hotdogs. Bratwurst. Salted nuts and seeds. Canned beans with added salt. Canned or smoked fish. Whole eggs or egg yolks. Chicken or [place] with skin. Dairy  Whole or 2% milk, cream, and half-and-half. Whole or full-fat cream cheese. Whole-fat or sweetened yogurt. Full-fat cheese. Nondairy creamers. Whipped toppings. Processed cheese and cheese spreads. Fats and oils  Butter. Stick margarine. Lard. Shortening. Ghee. Bacon fat. Tropical oils, such as coconut, palm kernel, or palm oil. Seasoning and other foods  Salted popcorn and pretzels. Onion salt, garlic salt, seasoned salt, table salt, and sea salt. Worcestershire sauce. Tartar sauce. Barbecue sauce. Teriyaki sauce. Soy sauce, including reduced-sodium. Steak sauce. Canned and packaged gravies. Fish sauce. Oyster sauce. Cocktail sauce. Horseradish that you find on the shelf. Ketchup. Mustard. Meat flavorings and tenderizers. Bouillon cubes. Hot sauce and Tabasco sauce. Premade or packaged marinades. Premade or packaged taco seasonings. Relishes. Regular salad dressings. Where to find more information:  National Heart, Lung, and Blood Institute: PopSteam.is  American Heart Association: www.heart.org Summary  The DASH eating plan is a healthy eating plan that has been shown to reduce high blood pressure (hypertension). It may also reduce your risk for type 2 diabetes, heart disease, and  stroke.  With the DASH eating plan, you should limit salt (sodium) intake to 2,300 mg a day. If you have hypertension, you may need to reduce your sodium intake to 1,500 mg a day.  When on the DASH eating plan, aim to eat more fresh fruits and vegetables, whole grains, lean proteins, low-fat dairy, and heart-healthy fats.  Work with your health care provider or diet and nutrition specialist (dietitian) to adjust your eating plan to your individual calorie needs. This information is not intended to replace advice given to you by  your health care provider. Make sure you discuss any questions you have with your health care provider. Document Released: 03/06/2011 Document Revised: 03/10/2016 Document Reviewed: 03/10/2016 Elsevier Interactive Patient Education  2017 [employer].

## 2016-05-29 NOTE — Progress Notes (Signed)
Careteam: Patient Care Team: Sharon Seller, NP as PCP - General (Geriatric Medicine) Oneal Grout, MD as Consulting Physician (Internal Medicine)  Advanced Directive information Does Patient Have a Medical Advance Directive?: No  No Known Allergies  Chief Complaint  Patient presents with  . Medical Management of Chronic Issues    4 week follow up on headache. Pt feels like topiramate is not helping headaches.   . Medication Refill    No refills needed at this time      HPI: Patient is a 31 y.o. female seen in the office today to follow up on headaches. Saw Dr Montez Morita for follow up after being started on Propranolol. Propranolol was not helpful and was changed to Topamax which has helped SOME, migraines are not as severe but she is still having. Taking Topamax 4 tablets daily. Still having them as frequently, not missing as much work but still having photosensitivity and sound sensitivity. She has dizziness, fatigue, increased sleeping, loss of appetite, nausea and feels lightheaded. Pain is a 4/10 on scale today but when they are bad they still are a 10/10. Increase anxiety and stress due to life, nothing specific.  Always worried about everything and been stressed out a lot.  Has started exercising this is helping some with anxiety Also has made some diet modifications.  Stadol made her throw up last time she took medication so she has not taken since.  Was following at headache clinic in the past which was NOT effective. Has not seen neurologist.   Review of Systems:  Review of Systems  Constitutional: Positive for activity change and fatigue.  Eyes: Positive for photophobia.  Gastrointestinal: Positive for nausea.  Musculoskeletal: Positive for arthralgias.  Neurological: Positive for dizziness, light-headedness and headaches.  Psychiatric/Behavioral: Positive for sleep disturbance. The patient is nervous/anxious.   All other systems reviewed and are  negative.   Past Medical History:  Diagnosis Date  . Allergic rhinitis   . Lower back pain    Past Surgical History:  Procedure Laterality Date  . HERNIA REPAIR  1989    Dr.Kathleen Samuel Bouche    Social History:   reports that she has never smoked. She has never used smokeless tobacco. She reports that she drinks alcohol. She reports that she does not use drugs.  Family History  Problem Relation Age of Onset  . Breast cancer Maternal Aunt   . Hypertension Mother     Medications: Patient's Medications  New Prescriptions   No medications on file  Previous Medications   BUTORPHANOL (STADOL) 10 MG/ML NASAL SPRAY    Place 1 spray into the nose every 4 (four) hours as needed for headache.   LEVONORGESTREL-ETHINYL ESTRADIOL (SETLAKIN) 0.15-0.03 MG TABLET    Take 1 tablet by mouth daily.   METHIMAZOLE (TAPAZOLE) 5 MG TABLET    Take 5 mg by mouth 4 (four) times a week. With food    PROPRANOLOL (INDERAL) 20 MG TABLET    Take 1 tablet (20 mg total) by mouth 3 (three) times daily.   TOPIRAMATE (TOPAMAX) 25 MG TABLET    Take 1 tablet (25 mg total) by mouth daily. May  take up to 4 tabs daily per instructions  Modified Medications   No medications on file  Discontinued Medications   No medications on file     Physical Exam:  Vitals:   05/29/16 1005  BP: (!) 148/89  Pulse: 90  Resp: 16  Temp: 98.4 F (36.9 C)  TempSrc: Oral  SpO2: 98%  Weight: 189 lb 3.2 oz (85.8 kg)  Height: 5\' 6"  (1.676 m)   Body mass index is 30.54 kg/m.  Physical Exam  Constitutional: She is oriented to person, place, and time. She appears well-developed and well-nourished. No distress.  Eyes: Pupils are equal, round, and reactive to light. No scleral icterus.  Neck: Neck supple. Thyromegaly (b/l) present.  Cardiovascular: Normal rate, regular rhythm, normal heart sounds and intact distal pulses.  Exam reveals no gallop and no friction rub.   No murmur heard. Pulmonary/Chest: Effort normal and breath  sounds normal. No respiratory distress. She has no wheezes. She has no rales. She exhibits no tenderness.  Abdominal: Soft. Bowel sounds are normal.  Lymphadenopathy:    She has no cervical adenopathy.  Neurological: She is alert and oriented to person, place, and time.  Skin: Skin is warm and dry. No rash noted.  Psychiatric: She has a normal mood and affect. Her behavior is normal.    Labs reviewed: Basic Metabolic Panel:  Recent Labs  16/12/9599/31/18 1423  NA 138  K 3.9  CL 104  CO2 26  GLUCOSE 75  BUN 8  CREATININE 0.68  CALCIUM 9.1  TSH 1.29   Liver Function Tests:  Recent Labs  04/30/16 1423  AST 17  ALT 13  ALKPHOS 37  BILITOT 0.6  PROT 7.8  ALBUMIN 4.3   No results for input(s): LIPASE, AMYLASE in the last 8760 hours. No results for input(s): AMMONIA in the last 8760 hours. CBC:  Recent Labs  04/30/16 1423  WBC 6.1  NEUTROABS 3,233  HGB 13.2  HCT 39.2  MCV 90.5  PLT 357   Lipid Panel:  Recent Labs  07/04/15 0830  CHOL 198  HDL 63  LDLCALC 106*  TRIG 147  CHOLHDL 3.1   TSH:  Recent Labs  04/30/16 1423  TSH 1.29   A1C: No results found for: HGBA1C   Assessment/Plan 1. Migraine without status migrainosus, not intractable, unspecified migraine type -reports a mild improvement on topamax, will cont for now because medication is providing benefit but migraines are not well controlled - Ambulatory referral to Neurology for further evaluation and treatment after being seen at the headache clinic with no success.   2. Anxiety Worsening anxiety. Pt to follow up with psychologist for CBT, to also cont exercise and proper diet.  - will start DULoxetine (CYMBALTA) 60 MG capsule; Take 1 capsule (60 mg total) by mouth daily.  Dispense: 30 capsule; Refill: 0 -to notify us if she is having adverse effects  3. Hyperlipidemia, unspecified hyperlipidemia type Heart healthy diet encouraged.  - Lipid Panel  4. Elevated BP without diagnosis of  hypertension To take bp at home and bring readings to next visit -DASH diet given  Follow up in 4 weeks    Tyna Huertas K. Biagio BorgEubanks, AGNP  [facility] [phone](Monday-Friday 8 am - 5 pm) [phone] (after hours)

## 2016-06-26 ENCOUNTER — Ambulatory Visit: Payer: BLUE CROSS/BLUE SHIELD | Admitting: Nurse Practitioner

## 2016-07-30 ENCOUNTER — Ambulatory Visit: Payer: BLUE CROSS/BLUE SHIELD | Admitting: Neurology

## 2016-12-31 ENCOUNTER — Ambulatory Visit: Payer: Self-pay | Admitting: Internal Medicine

## 2017-06-10 ENCOUNTER — Encounter (HOSPITAL_COMMUNITY): Payer: Self-pay | Admitting: Emergency Medicine

## 2017-06-10 ENCOUNTER — Ambulatory Visit (HOSPITAL_COMMUNITY)
Admission: EM | Admit: 2017-06-10 | Discharge: 2017-06-10 | Disposition: A | Discharge status: Home / Self Care | Payer: BLUE CROSS/BLUE SHIELD | Attending: Family Medicine | Admitting: Family Medicine

## 2017-06-10 DIAGNOSIS — J0100 Acute maxillary sinusitis, unspecified: Secondary | ICD-10-CM

## 2017-06-10 DIAGNOSIS — J029 Acute pharyngitis, unspecified: Secondary | ICD-10-CM | POA: Insufficient documentation

## 2017-06-10 DIAGNOSIS — Z8249 Family history of ischemic heart disease and other diseases of the circulatory system: Secondary | ICD-10-CM | POA: Insufficient documentation

## 2017-06-10 DIAGNOSIS — E0590 Thyrotoxicosis, unspecified without thyrotoxic crisis or storm: Secondary | ICD-10-CM | POA: Insufficient documentation

## 2017-06-10 DIAGNOSIS — Z803 Family history of malignant neoplasm of breast: Secondary | ICD-10-CM | POA: Insufficient documentation

## 2017-06-10 DIAGNOSIS — Z8639 Personal history of other endocrine, nutritional and metabolic disease: Secondary | ICD-10-CM

## 2017-06-10 HISTORY — DX: Disorder of thyroid, unspecified: E07.9

## 2017-06-10 LAB — TSH: TSH: 1.791 u[IU]/mL (ref 0.350–4.500)

## 2017-06-10 LAB — T4, FREE: Free T4: 0.74 ng/dL (ref 0.61–1.12)

## 2017-06-10 MED ORDER — METHIMAZOLE 5 MG PO TABS: 5.0000 mg | ORAL_TABLET | ORAL | 1 refills | Status: DC

## 2017-06-10 MED ORDER — AMOXICILLIN 875 MG PO TABS: 875.0000 mg | ORAL_TABLET | Freq: Two times a day (BID) | ORAL | 0 refills | Status: DC

## 2017-06-10 NOTE — ED Provider Notes (Signed)
[facility]ARE [facility]ENTER   [id number] 06/10/17 Arrival Time: 1208   SUBJECTIVE:  Mariah Rocha is a 32 y.o. female who presents to the urgent care with complaint of sore throat, coughing, congestion, headaches, "my whole face is hurting from blowing my nose". Symptoms since Thursday.  Patient has a history of hyperthyroidism and was on methimazole 50 mg daily until last August when she lost her insurance. She has no history of hypertension. Past Medical History:  Diagnosis Date  . Allergic rhinitis   . Lower back pain   . Thyroid disease    Family History  Problem Relation Age of Onset  . Breast cancer Maternal Aunt   . Hypertension Mother    Social History   Socioeconomic History  . Marital status: Single    Spouse name: Not on file  . Number of children: Not on file  . Years of education: Not on file  . Highest education level: Not on file  Social Needs  . Financial resource strain: Not on file  . Food insecurity - worry: Not on file  . Food insecurity - inability: Not on file  . Transportation needs - medical: Not on file  . Transportation needs - non-medical: Not on file  Occupational History  . Not on file  Tobacco Use  . Smoking status: Never Smoker  . Smokeless tobacco: Never Used  Substance and Sexual Activity  . Alcohol use: Yes    Alcohol/week: 0.0 oz    Comment: once a week  . Drug use: No  . Sexual activity: Yes    Birth control/protection: Pill  Other Topics Concern  . Not on file  Social History Narrative   GYN- Dr.Beth Maurice March    ENT- Dr. Flo Shanks   No outpatient medications have been marked as taking for the 06/10/17 encounter [facility] Encounter).   No Known Allergies    ROS: As per HPI, remainder of ROS negative.   OBJECTIVE:   Vitals:   06/10/17 1326 06/10/17 1400  BP: (!) 167/105 (!) 148/96  Pulse: (!) 113   Resp: 18   Temp: 98.9 F (37.2 C)   SpO2: 100%      General appearance: alert; no distress Eyes: PERRL; EOMI;  conjunctiva normal HENT: normocephalic; atraumatic; TMs normal, canal normal, external ears normal without trauma; nasal mucosa with marked mucopurulent discharge; oral mucosa normal Neck: supple; diffusely enlarged and smooth thyroid. Lungs: clear to auscultation bilaterally Heart: regular rate and rhythm Abdomen: soft, non-tender; bowel sounds normal; no masses or organomegaly; no guarding or rebound tenderness Back: no CVA tenderness Extremities: no cyanosis or edema; symmetrical with no gross deformities Skin: warm and dry Neurologic: normal gait; grossly normal Psychological: alert and cooperative; normal mood and affect      Labs:  Results for orders placed or performed in visit on 05/29/16  Lipid Panel  Result Value Ref Range   Cholesterol 193 <200 mg/dL   Triglycerides 87 <409 mg/dL   HDL 55 >81 mg/dL   Total CHOL/HDL Ratio 3.5 <5.0 Ratio   VLDL 17 <30 mg/dL   LDL Cholesterol 191 (H) <100 mg/dL    Labs Reviewed  T4, FREE  TSH    No results found.     ASSESSMENT & PLAN:  1. Acute maxillary sinusitis, recurrence not specified   2. H/O hyperthyroidism     Meds ordered this encounter  Medications  . methimazole (TAPAZOLE) 5 MG tablet    Sig: Take 1 tablet (5 mg total) by mouth 4 (four) times  a week. With food    Dispense:  30 tablet    Refill:  1  . amoxicillin (AMOXIL) 875 MG tablet    Sig: Take 1 tablet (875 mg total) by mouth 2 (two) times daily.    Dispense:  20 tablet    Refill:  0    Reviewed expectations re: course of current medical issues. Questions answered. Outlined signs and symptoms indicating need for more acute intervention. Patient verbalized understanding. After Visit Summary given.    Procedures:      Elvina SidleLauenstein, Viola Kinnick, MD 06/10/17 1404

## 2017-06-10 NOTE — ED Triage Notes (Signed)
Pt c/o sore throat, coughing, congestion, headaches, "my whole face is hruting from blowing my nose". Symptoms since Thursday.

## 2017-06-10 NOTE — Discharge Instructions (Signed)
Start Methimazole if T4 is elevated and TSH is very low.

## 2017-06-15 ENCOUNTER — Telehealth (HOSPITAL_COMMUNITY): Payer: Self-pay | Admitting: *Deleted

## 2017-06-15 NOTE — Telephone Encounter (Signed)
Pt informed of normal test results, verbalized understanding

## 2018-04-22 ENCOUNTER — Ambulatory Visit (INDEPENDENT_AMBULATORY_CARE_PROVIDER_SITE_OTHER): Payer: Medicaid Other | Admitting: Nurse Practitioner

## 2018-04-22 ENCOUNTER — Encounter: Payer: Self-pay | Admitting: Nurse Practitioner

## 2018-04-22 VITALS — BP 150/92 | HR 108 | Temp 97.4°F | Resp 10 | Ht 66.0 in | Wt 185.0 lb

## 2018-04-22 DIAGNOSIS — H6692 Otitis media, unspecified, left ear: Secondary | ICD-10-CM

## 2018-04-22 DIAGNOSIS — I10 Essential (primary) hypertension: Secondary | ICD-10-CM

## 2018-04-22 DIAGNOSIS — E0590 Thyrotoxicosis, unspecified without thyrotoxic crisis or storm: Secondary | ICD-10-CM

## 2018-04-22 DIAGNOSIS — G43909 Migraine, unspecified, not intractable, without status migrainosus: Secondary | ICD-10-CM

## 2018-04-22 MED ORDER — PROPRANOLOL HCL 40 MG PO TABS: 40.0000 mg | ORAL_TABLET | Freq: Two times a day (BID) | ORAL | 2 refills | Status: DC

## 2018-04-22 MED ORDER — AMOXICILLIN-POT CLAVULANATE 875-125 MG PO TABS: 1.0000 | ORAL_TABLET | Freq: Two times a day (BID) | ORAL | 0 refills | Status: DC

## 2018-04-22 NOTE — Patient Instructions (Signed)
Get insurance Encouraged to follow up with endocrinology  Start propranolol 40 mg by mouth twice daily for control of  blood pressure, heart rate, and headache.    Follow up in 2 weeks.

## 2018-04-22 NOTE — Progress Notes (Signed)
Careteam: Patient Care Team: Sharon Seller, NP as PCP - General (Geriatric Medicine) Oneal Grout, MD (Inactive) as Consulting Physician (Internal Medicine)  Advanced Directive information    No Known Allergies  Chief Complaint  Patient presents with  . Acute Visit    Ongoing daily headaches      HPI: Patient is a 33 y.o. female seen in the office today due to headaches. Has been seen in office several times due to uncontrolled headache and was referred to neurology however never went due to not having insurance.  Currently having headache- reports band around head, neck and shoulder hurts. Dizzy, lightheaded and nauseous "a little bit" Having daily headache for 1-2 months.  Living with a constant headache. Light, smell and sound sensitive. Pain a 10/10. Today 7/10.  Has changed diet, stopped pork but this did not help.  CT of head done in 2018 without abnormalities.   Has not had insurance and out of her thyroid medication- has more palpitations since she has not taken that. Previously following with endocrinologist. Always tired, hot and sweaty, palpitations.   LMP 04/08/18-04/14/18.   Review of Systems:  Review of Systems  Constitutional: Negative for chills, fever and malaise/fatigue.  HENT: Positive for congestion and sore throat.   Eyes:       Itchy eyes  Respiratory: Positive for cough. Negative for shortness of breath.   Cardiovascular: Positive for palpitations. Negative for chest pain.  Skin: Negative for itching and rash.  Neurological: Positive for dizziness and headaches. Negative for weakness.    Past Medical History:  Diagnosis Date  . Allergic rhinitis   . Lower back pain   . Thyroid disease    Past Surgical History:  Procedure Laterality Date  . HERNIA REPAIR  1989    Dr.Kathleen Samuel Bouche    Social History:   reports that she has never smoked. She has never used smokeless tobacco. She reports current alcohol use. She reports that she does  not use drugs.  Family History  Problem Relation Age of Onset  . Breast cancer Maternal Aunt   . Hypertension Mother     Medications: Patient's Medications  New Prescriptions   No medications on file  Previous Medications   PRENATAL VIT-FE FUMARATE-FA (PRENATAL MULTIVITAMIN) TABS TABLET    Take 1 tablet by mouth daily at 12 noon.  Modified Medications   No medications on file  Discontinued Medications   AMOXICILLIN (AMOXIL) 875 MG TABLET    Take 1 tablet (875 mg total) by mouth 2 (two) times daily.   LEVONORGESTREL-ETHINYL ESTRADIOL (SETLAKIN) 0.15-0.03 MG TABLET    Take 1 tablet by mouth daily.   METHIMAZOLE (TAPAZOLE) 5 MG TABLET    Take 1 tablet (5 mg total) by mouth 4 (four) times a week. With food     Physical Exam:  Vitals:   04/22/18 1059  BP: (!) 150/92  Pulse: (!) 108  Resp: 10  Temp: (!) 97.4 F (36.3 C)  SpO2: 98%  Weight: 185 lb (83.9 kg)  Height: 5\' 6"  (1.676 m)   Body mass index is 29.86 kg/m.  Physical Exam Constitutional:      General: She is not in acute distress.    Appearance: She is well-developed. She is not diaphoretic.  HENT:     Head: Normocephalic and atraumatic.     Right Ear: Tympanic membrane, ear canal and external ear normal.     Left Ear: A middle ear effusion is present. Tympanic membrane is erythematous.  Mouth/Throat:     Pharynx: No oropharyngeal exudate.  Eyes:     Conjunctiva/sclera: Conjunctivae normal.     Pupils: Pupils are equal, round, and reactive to light.  Neck:     Musculoskeletal: Normal range of motion and neck supple.  Cardiovascular:     Rate and Rhythm: Regular rhythm. Tachycardia present.     Heart sounds: Normal heart sounds.  Pulmonary:     Effort: Pulmonary effort is normal.     Breath sounds: Normal breath sounds.  Abdominal:     General: Bowel sounds are normal.     Palpations: Abdomen is soft.  Musculoskeletal:        General: No tenderness.  Skin:    General: Skin is warm and dry.    Neurological:     Mental Status: She is alert and oriented to person, place, and time.     Labs reviewed: Basic Metabolic Panel: Recent Labs    06/10/17 1401  TSH 1.791   Liver Function Tests: No results for input(s): AST, ALT, ALKPHOS, BILITOT, PROT, ALBUMIN in the last 8760 hours. No results for input(s): LIPASE, AMYLASE in the last 8760 hours. No results for input(s): AMMONIA in the last 8760 hours. CBC: No results for input(s): WBC, NEUTROABS, HGB, HCT, MCV, PLT in the last 8760 hours. Lipid Panel: No results for input(s): CHOL, HDL, LDLCALC, TRIG, CHOLHDL, LDLDIRECT in the last 8760 hours. TSH: Recent Labs    06/10/17 1401  TSH 1.791   A1C: No results found for: HGBA1C   Assessment/Plan 1. Hyperthyroidism -she has quit taking her tapazole for 4 months now with symptomatic hyperthyroid.  encouraged her to follow up with endocrinologist for ongoing management. For now will get labs to start evaluation. -encouraged to start using contraceptive as new medications are being prescribed in attempts to get symptoms under control.   - TSH - CBC with Differential/Platelets - BASIC METABOLIC PANEL WITH GFR - T4, Free - T3, Free - propranolol (INDERAL) 40 MG tablet; Take 1 tablet (40 mg total) by mouth 2 (two) times daily.  Dispense: 60 tablet; Refill: 2  2. Essential hypertension - propranolol (INDERAL) 40 MG tablet; Take 1 tablet (40 mg total) by mouth 2 (two) times daily.  Dispense: 60 tablet; Refill: 2  3. Migraine without status migrainosus, not intractable, unspecified migraine type -worse since she has stopped taking tapazole for hyperthryoid.  - propranolol (INDERAL) 40 MG tablet; Take 1 tablet (40 mg total) by mouth 2 (two) times daily.  Dispense: 60 tablet; Refill: 2  4. Left otitis media, unspecified otitis media type - amoxicillin-clavulanate (AUGMENTIN) 875-125 MG tablet; Take 1 tablet by mouth 2 (two) times daily.  Dispense: 14 tablet; Refill: 0  Next appt:  2 weeks.  Janene Harvey. Biagio Borg  [facility] [phone]

## 2018-04-23 LAB — CBC WITH DIFFERENTIAL/PLATELET
Absolute Monocytes: 341 cells/uL (ref 200–950)
BASOS PCT: 0.4 %
Basophils Absolute: 19 cells/uL (ref 0–200)
Eosinophils Absolute: 82 cells/uL (ref 15–500)
Eosinophils Relative: 1.7 %
HCT: 35.6 % (ref 35.0–45.0)
Hemoglobin: 12.4 g/dL (ref 11.7–15.5)
LYMPHS ABS: 2002 {cells}/uL (ref 850–3900)
MCH: 31.7 pg (ref 27.0–33.0)
MCHC: 34.8 g/dL (ref 32.0–36.0)
MCV: 91 fL (ref 80.0–100.0)
MPV: 11.1 fL (ref 7.5–12.5)
Monocytes Relative: 7.1 %
Neutro Abs: 2357 cells/uL (ref 1500–7800)
Neutrophils Relative %: 49.1 %
Platelets: 293 10*3/uL (ref 140–400)
RBC: 3.91 10*6/uL (ref 3.80–5.10)
RDW: 12.5 % (ref 11.0–15.0)
Total Lymphocyte: 41.7 %
WBC: 4.8 10*3/uL (ref 3.8–10.8)

## 2018-04-23 LAB — T4, FREE: Free T4: 0.9 ng/dL (ref 0.8–1.8)

## 2018-04-23 LAB — BASIC METABOLIC PANEL WITH GFR
BUN: 8 mg/dL (ref 7–25)
CO2: 26 mmol/L (ref 20–32)
Calcium: 9.7 mg/dL (ref 8.6–10.2)
Chloride: 104 mmol/L (ref 98–110)
Creat: 0.86 mg/dL (ref 0.50–1.10)
GFR, Est African American: 105 mL/min/{1.73_m2} (ref >=60)
GFR, Est Non African American: 91 mL/min/{1.73_m2} (ref >=60)
Glucose, Bld: 88 mg/dL (ref 65–99)
Potassium: 4.3 mmol/L (ref 3.5–5.3)
Sodium: 139 mmol/L (ref 135–146)

## 2018-04-23 LAB — TSH: TSH: 6.29 mIU/L — ABNORMAL HIGH

## 2018-04-23 LAB — T3, FREE: T3, Free: 3 pg/mL (ref 2.3–4.2)

## 2018-05-06 ENCOUNTER — Ambulatory Visit: Payer: Medicaid Other | Admitting: Nurse Practitioner

## 2018-05-06 ENCOUNTER — Encounter: Payer: Self-pay | Admitting: Nurse Practitioner

## 2018-05-06 VITALS — BP 146/88 | HR 76 | Temp 97.9°F | Ht 66.0 in | Wt 187.0 lb

## 2018-05-06 DIAGNOSIS — G43909 Migraine, unspecified, not intractable, without status migrainosus: Secondary | ICD-10-CM

## 2018-05-06 DIAGNOSIS — R000 Tachycardia, unspecified: Secondary | ICD-10-CM

## 2018-05-06 DIAGNOSIS — I10 Essential (primary) hypertension: Secondary | ICD-10-CM

## 2018-05-06 DIAGNOSIS — H6692 Otitis media, unspecified, left ear: Secondary | ICD-10-CM

## 2018-05-06 DIAGNOSIS — E039 Hypothyroidism, unspecified: Secondary | ICD-10-CM

## 2018-05-06 MED ORDER — AMLODIPINE BESYLATE 10 MG PO TABS: 5.0000 mg | ORAL_TABLET | Freq: Every day | ORAL | 1 refills | Status: DC

## 2018-05-06 MED ORDER — LEVOTHYROXINE SODIUM 25 MCG PO TABS: 25.0000 ug | ORAL_TABLET | Freq: Every day | ORAL | 0 refills | Status: DC

## 2018-05-06 NOTE — Patient Instructions (Addendum)
Add norvasc 5 mg (1/2 tablet) by mouth daily in addition to propanolol 50 mg by mouth twice daily  START levothyroxine 25 mcg. Take first thing in the morning with water 30 mins before you eat or drink or take any other medication.   CALL OFFICE in 1-2 weeks with blood pressure and HR readings (5-7 reading) make sure you take your medication before readings.   FOLLOW UP IN OFFICE in 6 weeks     DASH Eating Plan DASH stands for "Dietary Approaches to Stop Hypertension." The DASH eating plan is a healthy eating plan that has been shown to reduce high blood pressure (hypertension). It may also reduce your risk for type 2 diabetes, heart disease, and stroke. The DASH eating plan may also help with weight loss. What are tips for following this plan?  General guidelines  Avoid eating more than 2,300 mg (milligrams) of salt (sodium) a day. If you have hypertension, you may need to reduce your sodium intake to 1,500 mg a day.  Limit alcohol intake to no more than 1 drink a day for nonpregnant women and 2 drinks a day for men. One drink equals 12 oz of beer, 5 oz of wine, or 1 oz of hard liquor.  Work with your health care provider to maintain a healthy body weight or to lose weight. Ask what an ideal weight is for you.  Get at least 30 minutes of exercise that causes your heart to beat faster (aerobic exercise) most days of the week. Activities may include walking, swimming, or biking.  Work with your health care provider or diet and nutrition specialist (dietitian) to adjust your eating plan to your individual calorie needs. Reading food labels   Check food labels for the amount of sodium per serving. Choose foods with less than 5 percent of the Daily Value of sodium. Generally, foods with less than 300 mg of sodium per serving fit into this eating plan.  To find whole grains, look for the word "whole" as the first word in the ingredient list. Shopping  Buy products labeled as "low-sodium"  or "no salt added."  Buy fresh foods. Avoid canned foods and premade or frozen meals. Cooking  Avoid adding salt when cooking. Use salt-free seasonings or herbs instead of table salt or sea salt. Check with your health care provider or pharmacist before using salt substitutes.  Do not fry foods. Cook foods using healthy methods such as baking, boiling, grilling, and broiling instead.  Cook with heart-healthy oils, such as olive, canola, soybean, or sunflower oil. Meal planning  Eat a balanced diet that includes: ? 5 or more servings of fruits and vegetables each day. At each meal, try to fill half of your plate with fruits and vegetables. ? Up to 6-8 servings of whole grains each day. ? Less than 6 oz of lean meat, poultry, or fish each day. A 3-oz serving of meat is about the same size as a deck of cards. One egg equals 1 oz. ? 2 servings of low-fat dairy each day. ? A serving of nuts, seeds, or beans 5 times each week. ? Heart-healthy fats. Healthy fats called Omega-3 fatty acids are found in foods such as flaxseeds and coldwater fish, like sardines, salmon, and mackerel.  Limit how much you eat of the following: ? Canned or prepackaged foods. ? Food that is high in trans fat, such as fried foods. ? Food that is high in saturated fat, such as fatty meat. ? Sweets, desserts,  sugary drinks, and other foods with added sugar. ? Full-fat dairy products.  Do not salt foods before eating.  Try to eat at least 2 vegetarian meals each week.  Eat more home-cooked food and less restaurant, buffet, and fast food.  When eating at a restaurant, ask that your food be prepared with less salt or no salt, if possible. What foods are recommended? The items listed may not be a complete list. Talk with your dietitian about what dietary choices are best for you. Grains Whole-grain or whole-wheat bread. Whole-grain or whole-wheat pasta. Brown rice. [place] Morrow. Bulgur. Whole-grain and low-sodium  cereals. Pita bread. Low-fat, low-sodium crackers. Whole-wheat flour tortillas. Vegetables Fresh or frozen vegetables (raw, steamed, roasted, or grilled). Low-sodium or reduced-sodium tomato and vegetable juice. Low-sodium or reduced-sodium tomato sauce and tomato paste. Low-sodium or reduced-sodium canned vegetables. Fruits All fresh, dried, or frozen fruit. Canned fruit in natural juice (without added sugar). Meat and other protein foods Skinless chicken or [place]. Ground chicken or [place]. Pork with fat trimmed off. Fish and seafood. Egg whites. Dried beans, peas, or lentils. Unsalted nuts, nut butters, and seeds. Unsalted canned beans. Lean cuts of beef with fat trimmed off. Low-sodium, lean deli meat. Dairy Low-fat (1%) or fat-free (skim) milk. Fat-free, low-fat, or reduced-fat cheeses. Nonfat, low-sodium ricotta or cottage cheese. Low-fat or nonfat yogurt. Low-fat, low-sodium cheese. Fats and oils Soft margarine without trans fats. Vegetable oil. Low-fat, reduced-fat, or light mayonnaise and salad dressings (reduced-sodium). Canola, safflower, olive, soybean, and sunflower oils. Avocado. Seasoning and other foods Herbs. Spices. Seasoning mixes without salt. Unsalted popcorn and pretzels. Fat-free sweets. What foods are not recommended? The items listed may not be a complete list. Talk with your dietitian about what dietary choices are best for you. Grains Baked goods made with fat, such as croissants, muffins, or some breads. Dry pasta or rice meal packs. Vegetables Creamed or fried vegetables. Vegetables in a cheese sauce. Regular canned vegetables (not low-sodium or reduced-sodium). Regular canned tomato sauce and paste (not low-sodium or reduced-sodium). Regular tomato and vegetable juice (not low-sodium or reduced-sodium). Angie Fava. Olives. Fruits Canned fruit in a light or heavy syrup. Fried fruit. Fruit in cream or butter sauce. Meat and other protein foods Fatty cuts of meat. Ribs.  Fried meat. Berniece Salines. Sausage. Bologna and other processed lunch meats. Salami. Fatback. Hotdogs. Bratwurst. Salted nuts and seeds. Canned beans with added salt. Canned or smoked fish. Whole eggs or egg yolks. Chicken or [place] with skin. Dairy Whole or 2% milk, cream, and half-and-half. Whole or full-fat cream cheese. Whole-fat or sweetened yogurt. Full-fat cheese. Nondairy creamers. Whipped toppings. Processed cheese and cheese spreads. Fats and oils Butter. Stick margarine. Lard. Shortening. Ghee. Bacon fat. Tropical oils, such as coconut, palm kernel, or palm oil. Seasoning and other foods Salted popcorn and pretzels. Onion salt, garlic salt, seasoned salt, table salt, and sea salt. Worcestershire sauce. Tartar sauce. Barbecue sauce. Teriyaki sauce. Soy sauce, including reduced-sodium. Steak sauce. Canned and packaged gravies. Fish sauce. Oyster sauce. Cocktail sauce. Horseradish that you find on the shelf. Ketchup. Mustard. Meat flavorings and tenderizers. Bouillon cubes. Hot sauce and Tabasco sauce. Premade or packaged marinades. Premade or packaged taco seasonings. Relishes. Regular salad dressings. Where to find more information:  National Heart, Lung, and [place]: https://wilson-eaton.com/  American Heart Association: www.heart.org Summary  The DASH eating plan is a healthy eating plan that has been shown to reduce high blood pressure (hypertension). It may also reduce your risk for type 2 diabetes, heart disease,  and stroke.  With the DASH eating plan, you should limit salt (sodium) intake to 2,300 mg a day. If you have hypertension, you may need to reduce your sodium intake to 1,500 mg a day.  When on the DASH eating plan, aim to eat more fresh fruits and vegetables, whole grains, lean proteins, low-fat dairy, and heart-healthy fats.  Work with your health care provider or diet and nutrition specialist (dietitian) to adjust your eating plan to your individual calorie needs. This  information is not intended to replace advice given to you by your health care provider. Make sure you discuss any questions you have with your health care provider. Document Released: 03/06/2011 Document Revised: 03/10/2016 Document Reviewed: 03/10/2016 Elsevier Interactive Patient Education  2019 [employer].

## 2018-05-06 NOTE — Progress Notes (Signed)
Careteam: Patient Care Team: Sharon Seller, NP as PCP - General (Geriatric Medicine) Oneal Grout, MD (Inactive) as Consulting Physician (Internal Medicine)  Advanced Directive information Does Patient Have a Medical Advance Directive?: No, Would patient like information on creating a medical advance directive?: No - Patient declined  No Known Allergies  Chief Complaint  Patient presents with  . Medical Management of Chronic Issues    2 week follow up, migraines, painful around ears,need for influenza,referral to endocrinology     HPI: Patient is a 33 y.o. female seen in the office today to follow up.   Pt was seen 2 weeks ago due to migraine. Noted to have tachycardia. Hx of hyperthyroid and had been off all medication for 2 years due to no insurance. Repeat labs revealed normal free T3 and T4 with elevated TSH.  She had previously followed with endocrinology but has not seen in 2 years. Now she needs a new referral.   Started propanolol due to tachycardia, migraines, hypertension- she is still having migraines but today HR has improved. BP unchanged.   Continues to have a headache all the time. Will wake up with headache every day and then it will go away but comes back during the workday. Goes to bed with a headache. 4/10 currently.  Her migraines have been unchanged since 2014, in 2018 she was evaluated for headache/migraine. Despite attempts headaches have not been well managed.  Continues to have throbbing HA along left temporal and radiates to occiput. Sensitive to light and smell. Has also been to headache specialist and got trigger point injection which were not effective.   Otitis media- completed augmentin for 7 days, reports ears were achy yesterday but better today. Review of Systems:  Review of Systems  Constitutional: Positive for malaise/fatigue. Negative for chills, fever and weight loss.  HENT: Negative for congestion, ear discharge, ear pain and sinus  pain.   Respiratory: Negative for shortness of breath.   Cardiovascular: Positive for palpitations (improved but ongoing). Negative for chest pain and leg swelling.  Neurological: Positive for dizziness and headaches. Negative for tingling, tremors, sensory change, speech change, focal weakness and weakness.  Psychiatric/Behavioral: The patient is nervous/anxious.     Past Medical History:  Diagnosis Date  . Allergic rhinitis   . Lower back pain   . Thyroid disease    Past Surgical History:  Procedure Laterality Date  . HERNIA REPAIR  1989    Dr.Kathleen Samuel Bouche    Social History:   reports that she has never smoked. She has never used smokeless tobacco. She reports current alcohol use. She reports that she does not use drugs.  Family History  Problem Relation Age of Onset  . Breast cancer Maternal Aunt   . Hypertension Mother     Medications: Patient's Medications  New Prescriptions   No medications on file  Previous Medications   PROPRANOLOL (INDERAL) 40 MG TABLET    Take 1 tablet (40 mg total) by mouth 2 (two) times daily.  Modified Medications   No medications on file  Discontinued Medications   AMOXICILLIN-CLAVULANATE (AUGMENTIN) 875-125 MG TABLET    Take 1 tablet by mouth 2 (two) times daily.   PRENATAL VIT-FE FUMARATE-FA (PRENATAL MULTIVITAMIN) TABS TABLET    Take 1 tablet by mouth daily at 12 noon.     Physical Exam:  Vitals:   05/06/18 1102  BP: (!) 146/88  Pulse: 76  Temp: 97.9 F (36.6 C)  TempSrc: Oral  SpO2: 98%  Weight: 187 lb (84.8 kg)  Height: 5\' 6"  (1.676 m)   Body mass index is 30.18 kg/m.  Physical Exam Constitutional:      General: She is not in acute distress.    Appearance: She is well-developed. She is not diaphoretic.  HENT:     Head: Normocephalic and atraumatic.     Right Ear: Tympanic membrane, ear canal and external ear normal.     Left Ear: Tympanic membrane, ear canal and external ear normal.     Nose: Nose normal.      Mouth/Throat:     Pharynx: No oropharyngeal exudate.  Eyes:     Conjunctiva/sclera: Conjunctivae normal.     Pupils: Pupils are equal, round, and reactive to light.  Neck:     Musculoskeletal: Normal range of motion and neck supple.  Cardiovascular:     Rate and Rhythm: Normal rate and regular rhythm.     Heart sounds: Normal heart sounds.  Pulmonary:     Effort: Pulmonary effort is normal.     Breath sounds: Normal breath sounds.  Musculoskeletal:        General: No tenderness.  Skin:    General: Skin is warm and dry.  Neurological:     Mental Status: She is alert and oriented to person, place, and time.     Labs reviewed: Basic Metabolic Panel: Recent Labs    06/10/17 1401 04/22/18 1145  NA  --  139  K  --  4.3  CL  --  104  CO2  --  26  GLUCOSE  --  88  BUN  --  8  CREATININE  --  0.86  CALCIUM  --  9.7  TSH 1.791 6.29*   Liver Function Tests: No results for input(s): AST, ALT, ALKPHOS, BILITOT, PROT, ALBUMIN in the last 8760 hours. No results for input(s): LIPASE, AMYLASE in the last 8760 hours. No results for input(s): AMMONIA in the last 8760 hours. CBC: Recent Labs    04/22/18 1145  WBC 4.8  NEUTROABS 2,357  HGB 12.4  HCT 35.6  MCV 91.0  PLT 293   Lipid Panel: No results for input(s): CHOL, HDL, LDLCALC, TRIG, CHOLHDL, LDLDIRECT in the last 8760 hours. TSH: Recent Labs    06/10/17 1401 04/22/18 1145  TSH 1.791 6.29*   A1C: No results found for: HGBA1C   Assessment/Plan 1. Acquired hypothyroidism Previous hyperthyroid and had been following with endocrinology however now needing another referral to be seen there.  -now hypothyroid TSH 6.25 - Ambulatory referral to Endocrinology - levothyroxine (SYNTHROID, LEVOTHROID) 25 MCG tablet; Take 1 tablet (25 mcg total) by mouth daily before breakfast.  Dispense: 90 tablet; Refill: 0 -will follow up TSH in 6 weeks.   2. Migraine without status migrainosus, not intractable, unspecified migraine  type Ongoing, continues on propanolol 50 mg BID, pt does not have insurance and out of pocket expense was close to 80 dollars. Will change to metoprolol when refill needed since this is on 4$ list at [facility]. May need titration   3. Left otitis media, unspecified otitis media type improved  4. Essential hypertension Ongoing, will add norvasc to propanolol. To start with norvasc 1/2 tablet daily for blood pressure. Also discussed dietary modifications. DASH diet given - amLODipine (NORVASC) 10 MG tablet; Take 0.5 tablets (5 mg total) by mouth daily.  Dispense: 30 tablet; Refill: 1  5. Tachycardia Improved on betablocker.   Next appt: 6 weeks for follow up. She is agreeable to call office  and report blood pressure and HR readings after starting norvasc. May need to be adjusted based on home readings.  Janene HarveyJessica K. Biagio BorgEubanks, AGNP  [facility] [phone]

## 2018-05-13 ENCOUNTER — Other Ambulatory Visit: Payer: Self-pay

## 2018-06-04 ENCOUNTER — Encounter: Payer: Self-pay | Admitting: Internal Medicine

## 2018-06-04 ENCOUNTER — Other Ambulatory Visit: Payer: Self-pay

## 2018-06-04 ENCOUNTER — Ambulatory Visit (INDEPENDENT_AMBULATORY_CARE_PROVIDER_SITE_OTHER): Payer: Self-pay | Admitting: Internal Medicine

## 2018-06-04 VITALS — BP 124/78 | HR 94 | Ht 66.0 in | Wt 189.4 lb

## 2018-06-04 DIAGNOSIS — E039 Hypothyroidism, unspecified: Secondary | ICD-10-CM

## 2018-06-04 NOTE — Patient Instructions (Signed)
You are on levothyroxine - which is your thyroid hormone supplement. You MUST take this consistently.  You should take this first thing in the morning on an empty stomach with water. You should not take it with other medications. Wait to 1hr prior to eating. If you are taking any vitamins - please take these in the evening.   If you miss a dose, please take your missed dose the following day (double the dose for that day). You should have a pill box for ONLY levothyroxine on your bedside table to help you remember to take your medications.  - As soon as you realize you are pregnant, please increase your Thyroid hormone dose by 2 tablets a week

## 2018-06-04 NOTE — Progress Notes (Signed)
Name: Mariah Rocha  MRN/ DOB: [id number], Jul 11, 1985    Age/ Sex: 33 y.o., female    PCP: Sharon Seller, NP   Reason for Endocrinology Evaluation: Hypothyroidism     Date of Initial Endocrinology Evaluation: 06/04/2018     HPI: [name] is a 33 y.o. female with a past medical history of . The patient presented for initial endocrinology clinic visit on 06/04/2018 for consultative assistance with her abnormal TSH .   She was diagnosed with hyperthyroidism secondary to Graves' disease in 2015, she was having symptoms of palpitations and heat intolerance at the time.  She was started on Methimazole that she took for 2 years, she has been off for 1.5 yrs due to lack of insurance.  She presented again to her PCP for evaluation of headaches, during work up she was foun to have elevated TSH at 6.29 uIU/mL.  She was started on levothyroxine 25 MCG daily by her PCP.  Today she is accompanied by her sister.  She has noted weight gain, heat intolerance with mild tremors.  She has chronic constipation.  Denies depresison, palpitations or chest pain. She denies local neck symptoms  Does not take biotin products   She does not take any OTC meds.    LMP last week, regular, trying to conceive.   Paternal aunt with thyroid disease.   HISTORY:  Past Medical History:  Past Medical History:  Diagnosis Date  . Allergic rhinitis   . Lower back pain   . Thyroid disease     Past Surgical History:  Past Surgical History:  Procedure Laterality Date  . HERNIA REPAIR  1989    Dr.Kathleen Samuel Bouche       Social History:  reports that she has never smoked. She has never used smokeless tobacco. She reports current alcohol use. She reports that she does not use drugs.  Family History: family history includes Breast cancer in her maternal aunt; Hypertension in her mother.   HOME MEDICATIONS: Allergies as of 06/04/2018   No Known Allergies     Medication List       Accurate as  of June 04, 2018  8:36 AM. Always use your most recent med list.        amLODipine 10 MG tablet Commonly known as:  NORVASC Take 0.5 tablets (5 mg total) by mouth daily.   levothyroxine 25 MCG tablet Commonly known as:  SYNTHROID, LEVOTHROID Take 1 tablet (25 mcg total) by mouth daily before breakfast.   propranolol 40 MG tablet Commonly known as:  INDERAL Take 1 tablet (40 mg total) by mouth 2 (two) times daily.         REVIEW OF SYSTEMS: A comprehensive ROS was conducted with the patient and is negative except as per HPI and below:  Review of Systems  Constitutional: Negative for chills and fever.  HENT: Positive for congestion. Negative for sore throat.   Eyes: Negative for blurred vision and pain.  Respiratory: Negative for cough and shortness of breath.   Cardiovascular: Negative for chest pain and palpitations.  Gastrointestinal: Positive for constipation and nausea.  Genitourinary: Positive for frequency.  Neurological: Positive for tremors and headaches. Negative for tingling.  Endo/Heme/Allergies: Positive for polydipsia.  Psychiatric/Behavioral: Negative for depression.       OBJECTIVE:  VS: BP 124/78 (BP Location: Left Arm, Patient Position: Sitting, Cuff Size: Normal)   Pulse 94   Ht 5\' 6"  (1.676 m)   Wt 189 lb 6.4 oz (85.9 kg)  SpO2 98%   BMI 30.57 kg/m    Wt Readings from Last 3 Encounters:  06/04/18 189 lb 6.4 oz (85.9 kg)  05/06/18 187 lb (84.8 kg)  04/22/18 185 lb (83.9 kg)     EXAM: General: Pt appears well and is in NAD  Hydration: Well-hydrated with moist mucous membranes and good skin turgor  Eyes: External eye exam normal without stare, lid lag or exophthalmos.  EOM intact.  PERRL.  Ears, Nose, Throat: Hearing: Grossly intact bilaterally Dental: Good dentition  Throat: Clear without mass, erythema or exudate  Neck: General: Supple without adenopathy. Thyroid: Thyroid size normal.  No goiter or nodules appreciated. No thyroid bruit.    Lungs: Clear with good BS bilat with no rales, rhonchi, or wheezes  Heart: Auscultation: RRR.  Abdomen: Normoactive bowel sounds, soft, nontender, without masses or organomegaly palpable  Extremities:  BL LE: No pretibial edema normal ROM and strength.  Skin: Hair: Texture and amount normal with gender appropriate distribution Skin Inspection: No rashes. Skin Palpation: Skin temperature, texture, and thickness normal to palpation  Neuro: Cranial nerves: II - XII grossly intact  Motor: Normal strength throughout DTRs: 2+ and symmetric in UE without delay in relaxation phase  Mental Status: Judgment, insight: Intact Orientation: Oriented to time, place, and person Mood and affect: No depression, anxiety, or agitation     DATA REVIEWED:   Results for [name], [name] (MRN [id number]) as of 06/04/2018 08:36  Ref. Range 02/22/2014 09:11  Thyrotropin Receptor Ab Latest Ref Range: 0.00 - 1.75 IU/L 3.05 (H)  Results for Mariah, Rocha (MRN [id number]) as of 06/04/2018 08:36  Ref. Range 02/22/2014 09:11 04/30/2016 14:23 06/10/2017 14:01 04/22/2018 11:45  TSH Latest Units: mIU/L 0.005 (L) 1.29 1.791 6.29 (H)  Triiodothyronine,Free,Serum Latest Ref Range: 2.3 - 4.2 pg/mL    3.0  Triiodothyronine (T3) Latest Ref Range: 71 - 180 ng/dL 735 (H)     A7,OLID(CVUDTH) Latest Ref Range: 0.8 - 1.8 ng/dL 4.38 1.0 8.87 0.9   Old records , labs and images have been reviewed.    ASSESSMENT/PLAN/RECOMMENDATIONS:   1. Hypothyroidism:  -Her symptoms are not specific for hypothyroidism at this time. 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   -I agree with her PCP and starting LT-4 replacement at this time. - - Pt educated extensively on the correct way to take levothyroxine (first thing in the morning with water, 30 minutes before eating or taking other medications). - Pt encouraged to double dose the following day if she were to miss  a dose given long half-life of levothyroxine. -She was also advised to hold off on conceiving at this time until her TSH is normal again. -She was also instructed that once she is pregnant to increase her levothyroxine dose by 2 tablets a week, and to contact us to repeat TFTs.  -She will have repeat TFTs at her PCPs office in 2 weeks, which will be at the 6-week mark from starting LT-4 replacement.  Medications :  Continue levothyroxine 25 MCG daily  Follow-up in 8 weeks  Signed electronically by: Lyndle Herrlich, MD  [facility]  [facility] [address] [place] [place], [place] [address] Phone: [phone] FAX: [phone]   CC: Sharon Seller, NP [address] [place] [address] Phone: [phone] Fax: [phone]   Return to Endocrinology clinic as below: Future Appointments  Date Time Provider Department Center  06/17/2018 11:30  AM Sharon Seller, NP [facility] None

## 2018-06-09 ENCOUNTER — Other Ambulatory Visit: Payer: Self-pay

## 2018-06-09 ENCOUNTER — Encounter (HOSPITAL_COMMUNITY): Payer: Self-pay | Admitting: Emergency Medicine

## 2018-06-09 ENCOUNTER — Ambulatory Visit (HOSPITAL_COMMUNITY)
Admission: EM | Admit: 2018-06-09 | Discharge: 2018-06-09 | Disposition: A | Discharge status: Home / Self Care | Payer: Self-pay | Attending: Family Medicine | Admitting: Family Medicine

## 2018-06-09 DIAGNOSIS — J069 Acute upper respiratory infection, unspecified: Secondary | ICD-10-CM

## 2018-06-09 DIAGNOSIS — B9789 Other viral agents as the cause of diseases classified elsewhere: Secondary | ICD-10-CM

## 2018-06-09 MED ORDER — BENZONATATE 100 MG PO CAPS: ORAL_CAPSULE | ORAL | 0 refills | Status: DC

## 2018-06-09 NOTE — ED Triage Notes (Signed)
Pt c/o body aches, chills, sore throat, headache cough since Sunday.

## 2018-06-09 NOTE — ED Provider Notes (Signed)
[facility]   [id number] 06/09/18 Arrival Time: 1459  ASSESSMENT & PLAN:  1. Viral URI with cough    No concern for PNA. No indication for chest imaging at this time. Discussed. See AVS for discharge instructions.  Meds ordered this encounter  Medications  . benzonatate (TESSALON) 100 MG capsule    Sig: Take 1 capsule by mouth every 8 (eight) hours for cough.    Dispense:  21 capsule    Refill:  0   Work note provided. Discussed typical duration of symptoms. OTC symptom care as needed. Ensure adequate fluid intake and rest. May f/u with PCP or here as needed.  Reviewed expectations re: course of current medical issues. Questions answered. Outlined signs and symptoms indicating need for more acute intervention. Patient verbalized understanding. After Visit Summary given.   SUBJECTIVE: History from: patient.  Mariah Rocha is a 33 y.o. female who presents with complaint of nasal congestion, post-nasal drainage, and a persistent dry cough; with mild sore throat. Onset abrupt, yesterday; with mild fatigue and with mild body aches. SOB: none. Wheezing: none. Fever: questions subjective. Overall normal PO intake without n/v. Known sick contacts: no. No specific or significant aggravating or alleviating factors reported. OTC treatment: none.  Social History   Tobacco Use  Smoking Status Never Smoker  Smokeless Tobacco Never Used    ROS: As per HPI. All other systems negative.   OBJECTIVE:  Vitals:   06/09/18 1529  BP: (!) 147/90  Pulse: (!) 113  Resp: 16  Temp: 98.3 F (36.8 C)  SpO2: 95%     General appearance: alert; appears fatigued HEENT: nasal congestion; clear runny nose; throat irritation secondary to post-nasal drainage Neck: supple without LAD CV: tachycardia; regular rhythm Lungs: unlabored respirations, symmetrical air entry without wheezing; cough: moderate and dry Abd: soft Ext: no LE edema Skin: warm and dry Psychological: alert  and cooperative; normal mood and affect   No Known Allergies  Past Medical History:  Diagnosis Date  . Allergic rhinitis   . Lower back pain   . Thyroid disease    Family History  Problem Relation Age of Onset  . Breast cancer Maternal Aunt   . Hypertension Mother    Social History   Socioeconomic History  . Marital status: Single    Spouse name: Not on file  . Number of children: Not on file  . Years of education: Not on file  . Highest education level: Not on file  Occupational History  . Not on file  Social Needs  . Financial resource strain: Not on file  . Food insecurity:    Worry: Not on file    Inability: Not on file  . Transportation needs:    Medical: Not on file    Non-medical: Not on file  Tobacco Use  . Smoking status: Never Smoker  . Smokeless tobacco: Never Used  Substance and Sexual Activity  . Alcohol use: Yes    Alcohol/week: 0.0 standard drinks    Comment: once a week  . Drug use: No  . Sexual activity: Yes    Birth control/protection: Pill  Lifestyle  . Physical activity:    Days per week: Not on file    Minutes per session: Not on file  . Stress: Not on file  Relationships  . Social connections:    Talks on phone: Not on file    Gets together: Not on file    Attends religious service: Not on file    Active  member of club or organization: Not on file    Attends meetings of clubs or organizations: Not on file    Relationship status: Not on file  . Intimate partner violence:    Fear of current or ex partner: Not on file    Emotionally abused: Not on file    Physically abused: Not on file    Forced sexual activity: Not on file  Other Topics Concern  . Not on file  Social History Narrative   GYN- Dr.[facility]    ENT- Dr. Flo Shanks           Mardella Layman, MD 06/09/18 [id number]

## 2018-06-17 ENCOUNTER — Encounter: Payer: Self-pay | Admitting: Nurse Practitioner

## 2018-06-17 ENCOUNTER — Ambulatory Visit (INDEPENDENT_AMBULATORY_CARE_PROVIDER_SITE_OTHER): Payer: Self-pay | Admitting: Nurse Practitioner

## 2018-06-17 ENCOUNTER — Other Ambulatory Visit: Payer: Self-pay

## 2018-06-17 VITALS — BP 154/98 | HR 73 | Temp 97.9°F | Ht 66.0 in | Wt 186.6 lb

## 2018-06-17 DIAGNOSIS — H6692 Otitis media, unspecified, left ear: Secondary | ICD-10-CM

## 2018-06-17 DIAGNOSIS — E039 Hypothyroidism, unspecified: Secondary | ICD-10-CM

## 2018-06-17 DIAGNOSIS — I10 Essential (primary) hypertension: Secondary | ICD-10-CM

## 2018-06-17 DIAGNOSIS — R000 Tachycardia, unspecified: Secondary | ICD-10-CM

## 2018-06-17 DIAGNOSIS — G43909 Migraine, unspecified, not intractable, without status migrainosus: Secondary | ICD-10-CM

## 2018-06-17 MED ORDER — AZITHROMYCIN 250 MG PO TABS: ORAL_TABLET | ORAL | 0 refills | Status: DC

## 2018-06-17 NOTE — Progress Notes (Signed)
Careteam: Patient Care Team: Sharon Seller, NP as PCP - General (Geriatric Medicine) Oneal Grout, MD (Inactive) as Consulting Physician (Internal Medicine)  Advanced Directive information Does Patient Have a Medical Advance Directive?: No, Would patient like information on creating a medical advance directive?: No - Patient declined  No Known Allergies  Chief Complaint  Patient presents with  . Medical Management of Chronic Issues    6 week follow up, has been to endocrinology      HPI: Patient is a 33 y.o. female seen in the office today for follow up on blood pressure, HR, migraines and hypothyroid.   Reports symptoms of URI, went to ED on 06/09/2018, and was given tessalon pearl.  Sore throat, cough and chest hurt when she coughed, body aches, ear ache, night sweats, did not take temperature so unaware if she had fever, no recent travel.  No exposure to COVID-19, has been at home since started having symptoms. Continues to have cough, body aches, sore throat (7/10). Very congested in chest.   HTN- forgot to call with BP readings but brought log today. Taking norvasc 5 mg by mouth daily and taking propanolol. Bps- 132/96, 127/79, 116/72, 121/70  Tachycardia- remained controlled on propanolol.   Hypothyroid- saw endocrinology, continues on synthroid 25 mcg and following up in May   Migraines- overall have improved.   Review of Systems:  Review of Systems  Constitutional: Positive for chills and malaise/fatigue. Negative for fever.  HENT: Positive for congestion, ear pain, sinus pain and sore throat. Negative for hearing loss and nosebleeds.   Respiratory: Positive for cough. Negative for sputum production, shortness of breath and wheezing.   Cardiovascular: Negative for chest pain and palpitations.  Genitourinary: Negative for dysuria, frequency and urgency.  Musculoskeletal: Positive for myalgias.  Neurological: Positive for headaches. Negative for dizziness.     Past Medical History:  Diagnosis Date  . Allergic rhinitis   . Lower back pain   . Thyroid disease    Past Surgical History:  Procedure Laterality Date  . HERNIA REPAIR  1989    Dr.Kathleen Samuel Bouche    Social History:   reports that she has never smoked. She has never used smokeless tobacco. She reports current alcohol use. She reports that she does not use drugs.  Family History  Problem Relation Age of Onset  . Breast cancer Maternal Aunt   . Hypertension Mother     Medications: Patient's Medications  New Prescriptions   No medications on file  Previous Medications   AMLODIPINE (NORVASC) 10 MG TABLET    Take 0.5 tablets (5 mg total) by mouth daily.   BENZONATATE (TESSALON) 100 MG CAPSULE    Take 1 capsule by mouth every 8 (eight) hours for cough.   LEVOTHYROXINE (SYNTHROID, LEVOTHROID) 25 MCG TABLET    Take 1 tablet (25 mcg total) by mouth daily before breakfast.   PROPRANOLOL (INDERAL) 40 MG TABLET    Take 1 tablet (40 mg total) by mouth 2 (two) times daily.  Modified Medications   No medications on file  Discontinued Medications   No medications on file     Physical Exam:  Vitals:   06/17/18 1119  BP: (!) 154/98  Pulse: 73  Temp: 97.9 F (36.6 C)  TempSrc: Oral  SpO2: 96%  Weight: 186 lb 9.6 oz (84.6 kg)  Height: 5\' 6"  (1.676 m)   Body mass index is 30.12 kg/m.  Physical Exam Constitutional:      General: She is not  in acute distress.    Appearance: She is well-developed. She is not diaphoretic.  HENT:     Head: Normocephalic and atraumatic.     Right Ear: Tympanic membrane, ear canal and external ear normal.     Left Ear: A middle ear effusion is present. Tympanic membrane is erythematous.     Mouth/Throat:     Pharynx: Posterior oropharyngeal erythema present. No oropharyngeal exudate.  Eyes:     Conjunctiva/sclera: Conjunctivae normal.     Pupils: Pupils are equal, round, and reactive to light.  Neck:     Musculoskeletal: Normal range of motion  and neck supple.  Cardiovascular:     Rate and Rhythm: Regular rhythm. Tachycardia present.     Heart sounds: Normal heart sounds.  Pulmonary:     Effort: Pulmonary effort is normal.     Breath sounds: Normal breath sounds.  Abdominal:     General: Bowel sounds are normal.     Palpations: Abdomen is soft.  Musculoskeletal:        General: No tenderness.  Skin:    General: Skin is warm and dry.  Neurological:     Mental Status: She is alert and oriented to person, place, and time.     Labs reviewed: Basic Metabolic Panel: Recent Labs    04/22/18 1145  NA 139  K 4.3  CL 104  CO2 26  GLUCOSE 88  BUN 8  CREATININE 0.86  CALCIUM 9.7  TSH 6.29*   Liver Function Tests: No results for input(s): AST, ALT, ALKPHOS, BILITOT, PROT, ALBUMIN in the last 8760 hours. No results for input(s): LIPASE, AMYLASE in the last 8760 hours. No results for input(s): AMMONIA in the last 8760 hours. CBC: Recent Labs    04/22/18 1145  WBC 4.8  NEUTROABS 2,357  HGB 12.4  HCT 35.6  MCV 91.0  PLT 293   Lipid Panel: No results for input(s): CHOL, HDL, LDLCALC, TRIG, CHOLHDL, LDLDIRECT in the last 8760 hours. TSH: Recent Labs    04/22/18 1145  TSH 6.29*   A1C: No results found for: HGBA1C   Assessment/Plan 1. Acquired hypothyroidism -endocrine managing at this time, continues on synthroid 25 mcg and follow up scheduled for recheck TSH  2. Migraine without status migrainosus, not intractable, unspecified migraine type -has improved on propanolol, may transition to metoprolol due to cost when she runs out of propanolol however now symptoms are well managed.   3. Left otitis media, unspecified otitis media type Ongoing, completed augmentin in late January.  - azithromycin (ZITHROMAX) 250 MG tablet; 2 tablets by mouth today then 1 daily until complete  Dispense: 6 tablet; Refill: 0  4. Essential hypertension -elevated in office, bp improved to 138/90 on recheck but home BP are much  better. Will cont current regimen  5. Tachycardia Controlled on propanolol.   Next appt: 6 months, sooner if needed Ladd Cen K. Biagio Borg  [facility] [phone]

## 2018-06-17 NOTE — Patient Instructions (Addendum)
To start azithromycin To use mucinex DM by mouth twice daily with full glass of water Increase water intake.

## 2018-07-30 ENCOUNTER — Encounter: Payer: Self-pay | Admitting: Internal Medicine

## 2018-07-30 ENCOUNTER — Ambulatory Visit (INDEPENDENT_AMBULATORY_CARE_PROVIDER_SITE_OTHER): Payer: Medicaid Other | Admitting: Internal Medicine

## 2018-07-30 ENCOUNTER — Other Ambulatory Visit: Payer: Self-pay

## 2018-07-30 DIAGNOSIS — E039 Hypothyroidism, unspecified: Secondary | ICD-10-CM | POA: Insufficient documentation

## 2018-07-30 DIAGNOSIS — Z8639 Personal history of other endocrine, nutritional and metabolic disease: Secondary | ICD-10-CM | POA: Insufficient documentation

## 2018-07-30 NOTE — Progress Notes (Signed)
Virtual Visit via Video Note  I connected with Bridget Hartshorn on 07/30/18 at  8:30 AM EDT by a video enabled telemedicine application and verified that I am speaking with the correct person using two identifiers.   I discussed the limitations of evaluation and management by telemedicine and the availability of in person appointments. The patient expressed understanding and agreed to proceed.  -Location of the patient :  Home -Location of the provider : Office -The names of all persons participating in the telemedicine service : Pt and myself    Name: Shelena Bettner  MRN/ DOB: [id number], [birth year]    Age/ Sex: 33 y.o., female     PCP: Sharon [facility], NP   Reason for Endocrinology Evaluation: Hypothyroidism     Initial Endocrinology Clinic Visit: 06/04/2018    PATIENT IDENTIFIER: Ms. Keniah Donofrio is a 33 y.o., female with a past medical history of Graves' disease. She has followed with [facility] since 06/04/2018 for consultative assistance with management of her hypothyroidism.   HISTORICAL SUMMARY: The patient was first diagnosed with hyperthyroidism secondary to Graves' disease in 2015.  She was started on Methimazole that she took for 2 years but stopped due to lack of insurance. She presented again to her PCP in 03/2018 for evaluation of headaches, during work up she was foun to have elevated TSH at 6.29 uIU/mL.  She was started on levothyroxine 25 MCG daily by her PCP. Paternal aunt with thyroid disease.  SUBJECTIVE:   During last visit (06/04/2018): Continued Levothyroxine 25 mg daily.  Today (07/30/2018):  Ms. Favorite is here for an 8 weeks virtual follow up on hypothyroidism. She continues to take levothyroxine on daily basis. She takes it correctly. Her weight has been stable. She has chronic constipation and this has not changed.   LMP 4/15 She did notice heat intolerance and sweating.    ROS:  As per HPI.   HISTORY:  Past Medical History:  Past  Medical History:  Diagnosis Date  . Allergic rhinitis   . Lower back pain   . Thyroid disease    Past Surgical History:  Past Surgical History:  Procedure Laterality Date  . HERNIA REPAIR  1989    Dr.Kathleen Samuel Bouche     Social History:  reports that she has never smoked. She has never used smokeless tobacco. She reports current alcohol use. She reports that she does not use drugs. Family History:  Family History  Problem Relation Age of Onset  . Breast cancer Maternal Aunt   . Hypertension Mother      HOME MEDICATIONS: Allergies as of 07/30/2018   No Known Allergies     Medication List       Accurate as of Jul 30, 2018  8:40 AM. Always use your most recent med list.        amLODipine 10 MG tablet Commonly known as:  NORVASC Take 0.5 tablets (5 mg total) by mouth daily.   azithromycin 250 MG tablet Commonly known as:  ZITHROMAX 2 tablets by mouth today then 1 daily until complete   benzonatate 100 MG capsule Commonly known as:  TESSALON Take 1 capsule by mouth every 8 (eight) hours for cough.   levothyroxine 25 MCG tablet Commonly known as:  SYNTHROID Take 1 tablet (25 mcg total) by mouth daily before breakfast.   propranolol 40 MG tablet Commonly known as:  INDERAL Take 1 tablet (40 mg total) by mouth 2 (two) times daily.  DATA REVIEWED:  Results for Bridget HartshornMCNEIL, Marquerite (MRN [id number]) as of 07/30/2018 08:40  Ref. Range 06/10/2017 14:01 04/22/2018 11:45  TSH Latest Units: mIU/L 1.791 6.29 (H)  Triiodothyronine,Free,Serum Latest Ref Range: 2.3 - 4.2 pg/mL  3.0  T4,Free(Direct) Latest Ref Range: 0.8 - 1.8 ng/dL 2.130.74 0.9     ASSESSMENT / PLAN / RECOMMENDATIONS:   1. Hypothyroidism:    Clinically euthyroid  Pt educated extensively on the correct way to take levothyroxine (first thing in the morning with water, 30 minutes before eating or taking other medications).  - Pt encouraged to double dose the following day if she were to miss a dose given  long half-life of levothyroxine.  She was advised to hold off on prenatals for 48 hrs before labs work.   She was instructed to contact our office to schedule a lab appointment    Medications  Levothyroxine 25 mcg daily    I discussed the assessment and treatment plan with the patient. The patient was provided an opportunity to ask questions and all were answered. The patient agreed with the plan and demonstrated an understanding of the instructions.   The patient was advised to call back or seek an in-person evaluation if the symptoms worsen or if the condition fails to improve as anticipated.  F/u in 3 months   Signed electronically by: Lyndle HerrlichAbby Jaralla Chapman Matteucci, MD  [facility]  [facility] [address] [place], [place] [address] Phone: [phone] FAX: [phone]   CC: Sharon Sellerubanks, Jessica K, NP [address] [place] [address] Phone: [phone] Fax: [phone]   Return to Endocrinology clinic as below: Future Appointments  Date Time Provider Department Center  12/17/2018 10:30 AM Sharon SellerEubanks, Jessica K, NP [facility] None

## 2018-08-06 ENCOUNTER — Other Ambulatory Visit: Payer: Self-pay | Admitting: *Deleted

## 2018-08-06 DIAGNOSIS — E039 Hypothyroidism, unspecified: Secondary | ICD-10-CM

## 2018-08-06 DIAGNOSIS — G43909 Migraine, unspecified, not intractable, without status migrainosus: Secondary | ICD-10-CM

## 2018-08-06 DIAGNOSIS — E0590 Thyrotoxicosis, unspecified without thyrotoxic crisis or storm: Secondary | ICD-10-CM

## 2018-08-06 DIAGNOSIS — I10 Essential (primary) hypertension: Secondary | ICD-10-CM

## 2018-08-06 MED ORDER — PROPRANOLOL HCL 40 MG PO TABS: 40.0000 mg | ORAL_TABLET | Freq: Two times a day (BID) | ORAL | 1 refills | Status: DC

## 2018-08-06 MED ORDER — LEVOTHYROXINE SODIUM 25 MCG PO TABS: 25.0000 ug | ORAL_TABLET | Freq: Every day | ORAL | 1 refills | Status: DC

## 2018-08-06 NOTE — Telephone Encounter (Signed)
Patient Requested 

## 2018-08-22 IMAGING — CT CT HEAD W/O CM
3 of 4 series · 17 of 47 positions shown, 20 images · non-contrast
Comparison: MRI 07/06/2004

CLINICAL DATA: Left side headache.  Migraines.

EXAM:
CT HEAD WITHOUT CONTRAST
TECHNIQUE: Contiguous axial images were obtained from the base of the skull
through the vertex without intravenous contrast.

[Series 32: 3d filtered head w/o · axial · non-contrast · 0.49mm/px · z∈[-2,+128]mm · 11 of 32 slices shown, 14 images]
[im 3/32  brain]
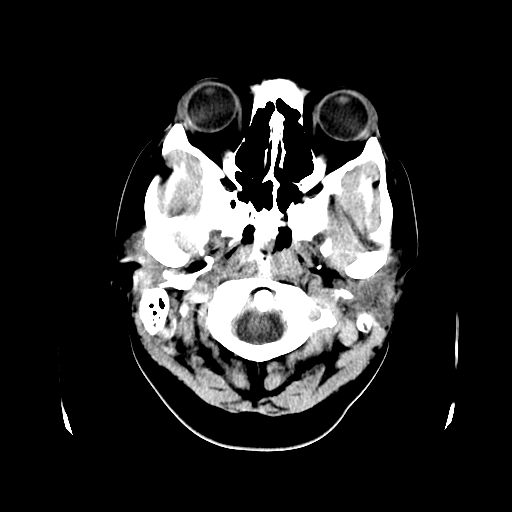
[im 3/32  bone]
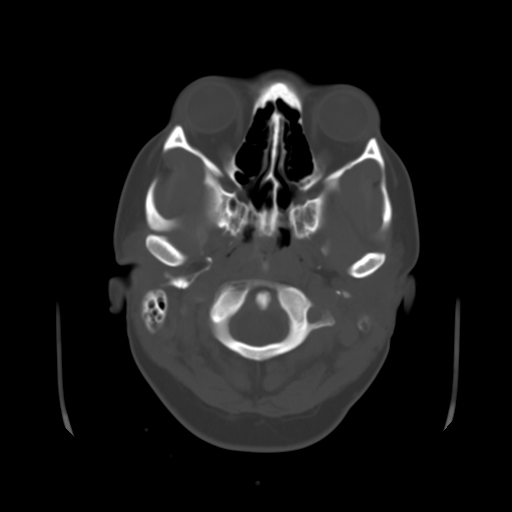
[im 5/32  brain]
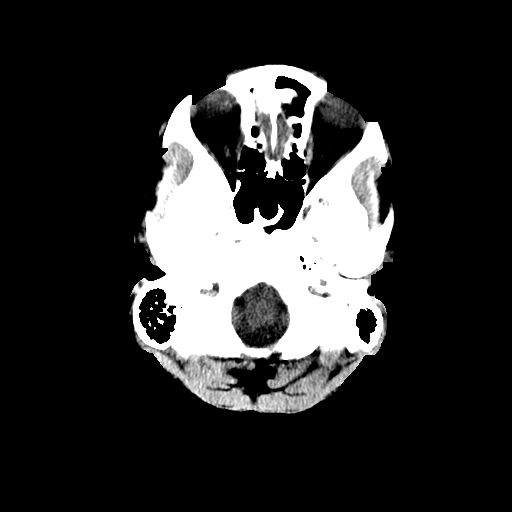
[im 7/32  brain]
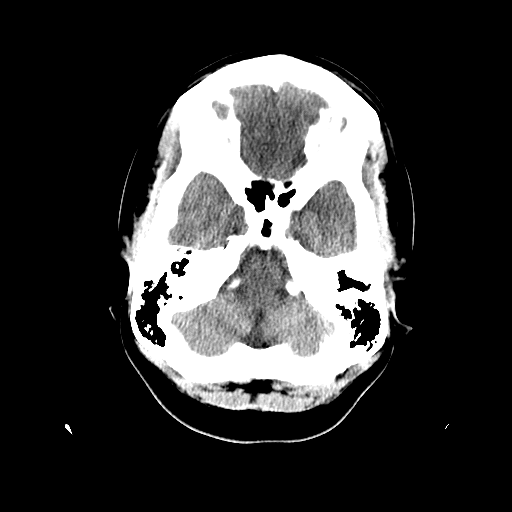
[im 12/32  brain]
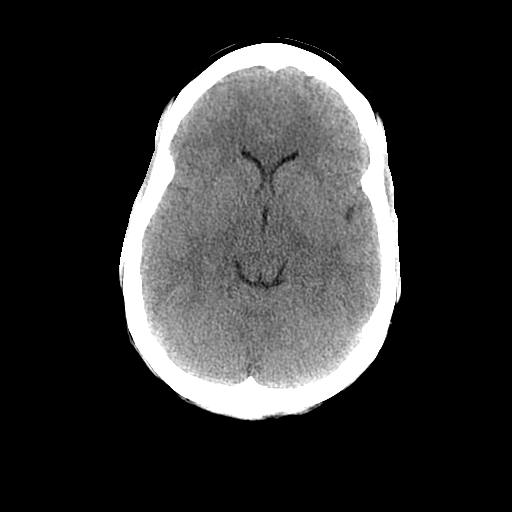
[im 14/32  brain]
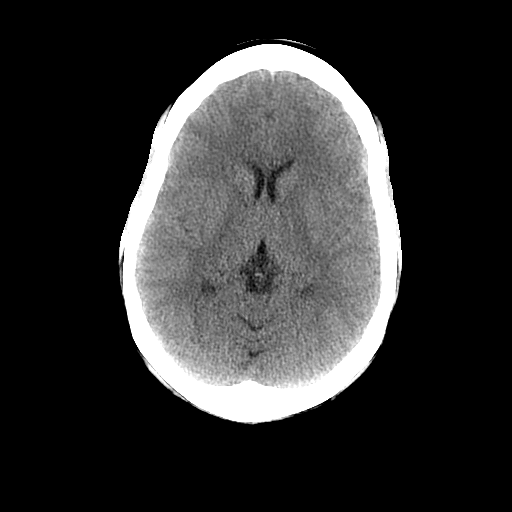
[im 14/32  bone]
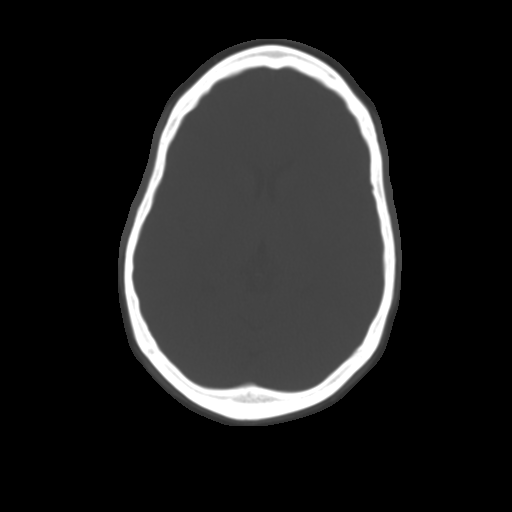
[im 16/32  brain]
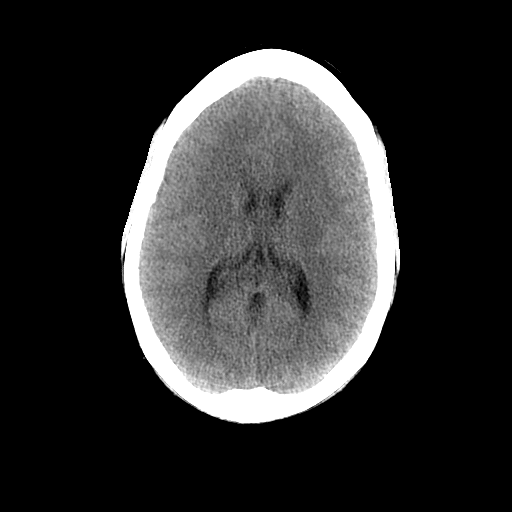
[im 18/32  brain]
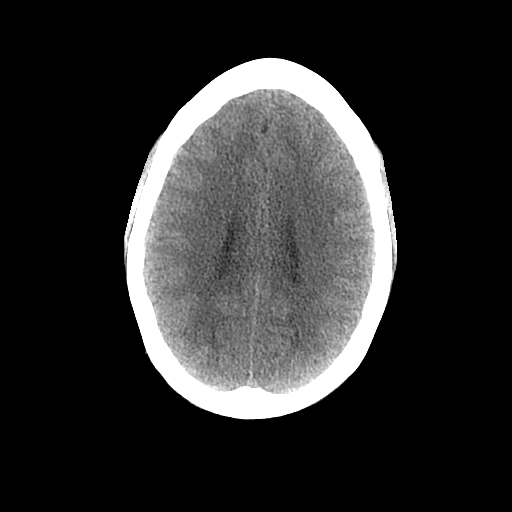
[im 20/32  brain]
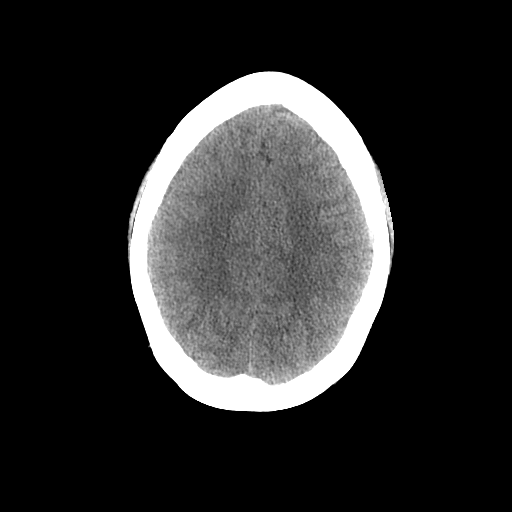
[im 25/32  brain]
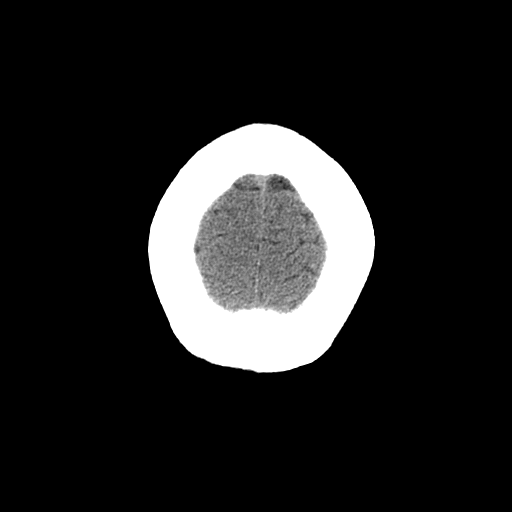
[im 25/32  bone]
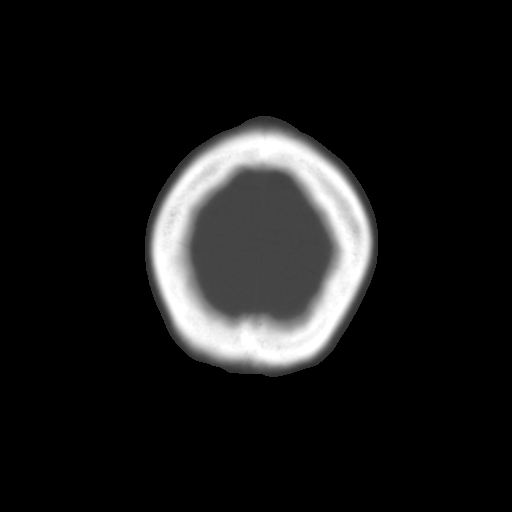
[im 27/32  brain]
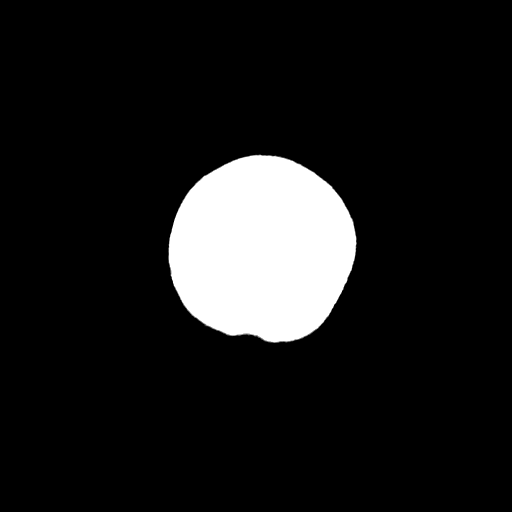
[im 29/32  brain]
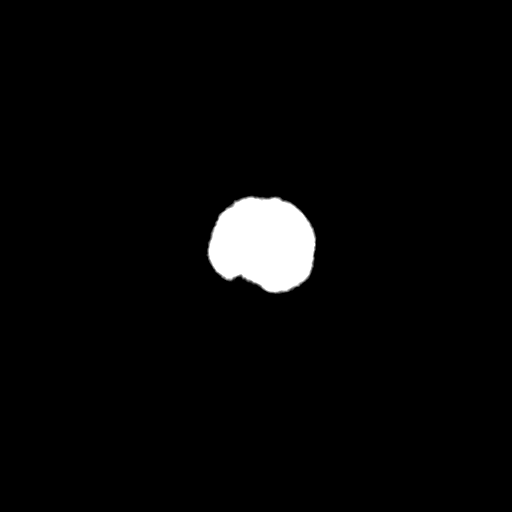

[Series 601: coronal brain · coronal · 0.49mm/px · 3 of 70 slices shown]
[im 24/70  brain]
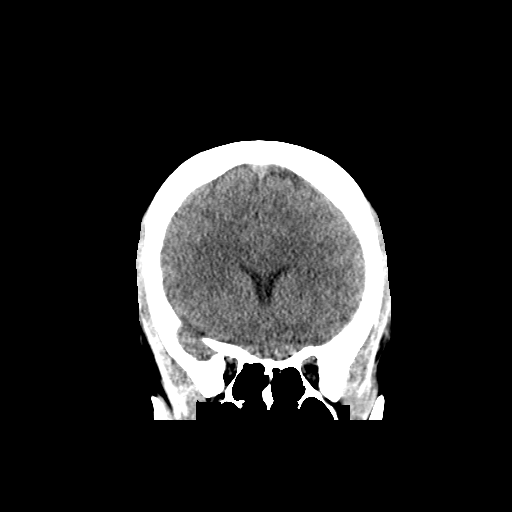
[im 31/70  brain]
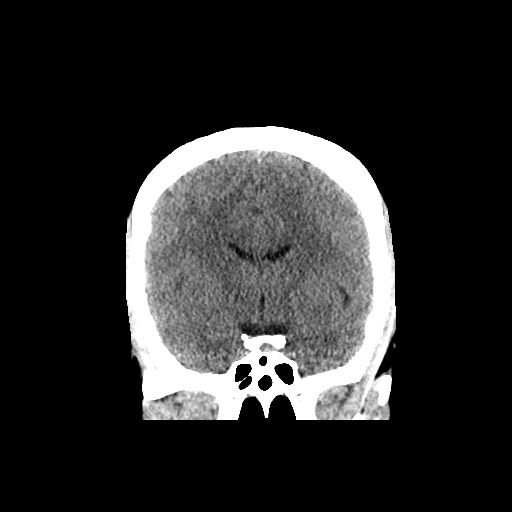
[im 39/70  brain]
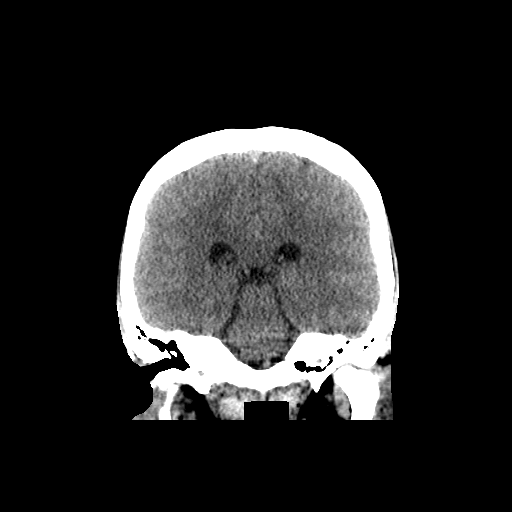

[Series 602: sagittal brain · sagittal · 0.49mm/px · 3 of 56 slices shown]
[im 19/56  brain]
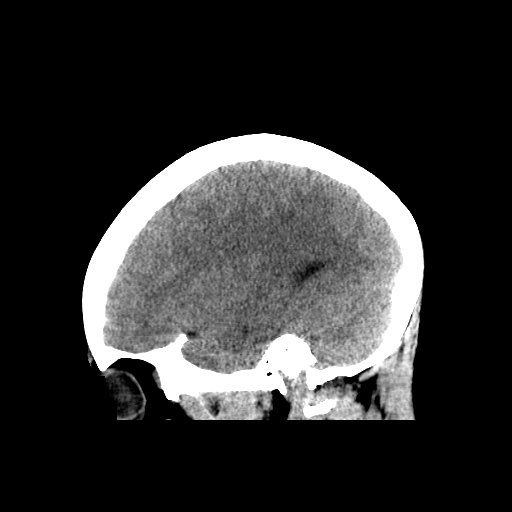
[im 28/56  brain]
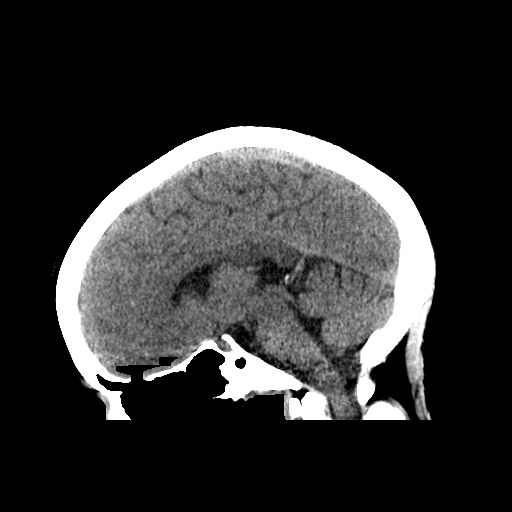
[im 37/56  brain]
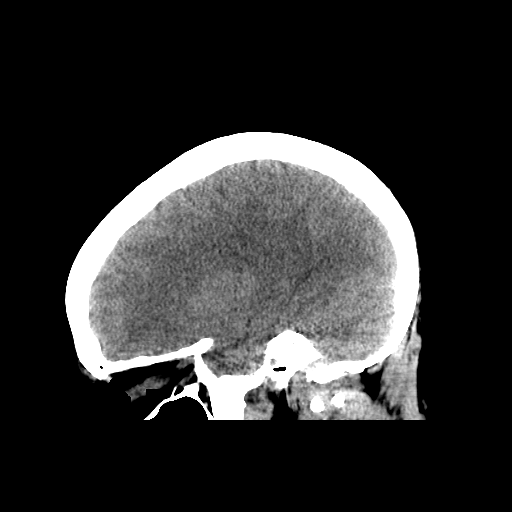

[17 of 47 positions shown; findings below may reference images not displayed]

FINDINGS: Brain: No acute intracranial abnormality. Specifically, no
hemorrhage, hydrocephalus, mass lesion, acute infarction, or
significant intracranial injury.

Vascular: No hyperdense vessel or unexpected calcification.

Skull: No acute calvarial abnormality.

Sinuses/Orbits: Visualized paranasal sinuses and mastoids clear.
Orbital soft tissues unremarkable.

Other: None
IMPRESSION: Normal study.

## 2018-08-31 ENCOUNTER — Telehealth: Payer: Self-pay | Admitting: *Deleted

## 2018-08-31 NOTE — Telephone Encounter (Signed)
Patient called and stated that you told her to call with a week worth of BP Readings. Currently taking Amlodipine 5mg  daily and Propranolol 40mg  twice daily  08/23/18- 113/71  Pulse 75 08/24/18- 122/79  Pulse 81 08/25/18- 123/68  Pulse 100 08/26/18- 124/75  Pulse 90 08/27/18- 107/67  Pulse 85

## 2018-08-31 NOTE — Telephone Encounter (Signed)
Look good! Continue current regimen with dietary modifications.

## 2018-08-31 NOTE — Telephone Encounter (Signed)
Patient notified and agreed.  

## 2018-09-07 ENCOUNTER — Other Ambulatory Visit: Payer: Self-pay | Admitting: Nurse Practitioner

## 2018-09-07 DIAGNOSIS — I10 Essential (primary) hypertension: Secondary | ICD-10-CM

## 2018-09-09 ENCOUNTER — Other Ambulatory Visit (INDEPENDENT_AMBULATORY_CARE_PROVIDER_SITE_OTHER): Payer: Self-pay

## 2018-09-09 ENCOUNTER — Other Ambulatory Visit: Payer: Self-pay

## 2018-09-09 DIAGNOSIS — E039 Hypothyroidism, unspecified: Secondary | ICD-10-CM

## 2018-09-09 LAB — T4, FREE: Free T4: 0.85 ng/dL (ref 0.60–1.60)

## 2018-09-09 LAB — TSH: TSH: 5.33 u[IU]/mL — ABNORMAL HIGH (ref 0.35–4.50)

## 2018-09-10 ENCOUNTER — Telehealth: Payer: Self-pay | Admitting: Internal Medicine

## 2018-09-10 MED ORDER — LEVOTHYROXINE SODIUM 50 MCG PO TABS: 50.0000 ug | ORAL_TABLET | Freq: Every day | ORAL | 3 refills | Status: DC

## 2018-09-10 NOTE — Telephone Encounter (Signed)
Discussed elevated TSH as below    Results for Mariah Rocha, Mariah Rocha (MRN [id number]) as of 09/10/2018 10:17  Ref. Range 09/09/2018 09:11  TSH Latest Ref Range: 0.35 - 4.50 uIU/mL 5.33 (H)  T4,Free(Direct) Latest Ref Range: 0.60 - 1.60 ng/dL 0.85    Recommendations   Increase Levothyroxine to 50 mcg daily   F/u in 2 months   [place], MD  [facility]  [facility] [address] [place], [place] [address] Phone: [phone] FAX: [phone]

## 2018-11-03 ENCOUNTER — Other Ambulatory Visit: Payer: Self-pay | Admitting: Nurse Practitioner

## 2018-11-03 DIAGNOSIS — I10 Essential (primary) hypertension: Secondary | ICD-10-CM

## 2018-12-17 ENCOUNTER — Ambulatory Visit: Payer: Medicaid Other | Admitting: Nurse Practitioner

## 2019-02-04 ENCOUNTER — Other Ambulatory Visit: Payer: Self-pay | Admitting: Nurse Practitioner

## 2019-02-04 DIAGNOSIS — I10 Essential (primary) hypertension: Secondary | ICD-10-CM

## 2019-02-14 ENCOUNTER — Encounter: Payer: Self-pay | Admitting: Nurse Practitioner

## 2019-02-14 ENCOUNTER — Other Ambulatory Visit: Payer: Self-pay

## 2019-02-14 ENCOUNTER — Ambulatory Visit (INDEPENDENT_AMBULATORY_CARE_PROVIDER_SITE_OTHER): Payer: Self-pay | Admitting: Nurse Practitioner

## 2019-02-14 VITALS — BP 164/98 | HR 105 | Temp 98.2°F | Ht 66.0 in | Wt 191.0 lb

## 2019-02-14 DIAGNOSIS — E039 Hypothyroidism, unspecified: Secondary | ICD-10-CM

## 2019-02-14 DIAGNOSIS — F419 Anxiety disorder, unspecified: Secondary | ICD-10-CM

## 2019-02-14 DIAGNOSIS — R000 Tachycardia, unspecified: Secondary | ICD-10-CM

## 2019-02-14 DIAGNOSIS — I10 Essential (primary) hypertension: Secondary | ICD-10-CM

## 2019-02-14 DIAGNOSIS — G43909 Migraine, unspecified, not intractable, without status migrainosus: Secondary | ICD-10-CM

## 2019-02-14 DIAGNOSIS — Z23 Encounter for immunization: Secondary | ICD-10-CM

## 2019-02-14 DIAGNOSIS — J302 Other seasonal allergic rhinitis: Secondary | ICD-10-CM

## 2019-02-14 MED ORDER — CITALOPRAM HYDROBROMIDE 10 MG PO TABS: 10.0000 mg | ORAL_TABLET | Freq: Every day | ORAL | 1 refills | Status: DC

## 2019-02-14 NOTE — Progress Notes (Signed)
Careteam: Patient Care Team: Sharon Seller, NP as PCP - General (Geriatric Medicine) Oneal Grout, MD (Inactive) as Consulting Physician (Internal Medicine)  Advanced Directive information Does Patient Have a Medical Advance Directive?: No, Would patient like information on creating a medical advance directive?: No - Patient declined  No Known Allergies  Chief Complaint  Patient presents with  . Medical Management of Chronic Issues    6 month follow-up. Headaches are better. Patient still with ear issues and questions if related to sinuses   . Immunizations    Flu vaccine today      HPI: Patient is a 33 y.o. female seen in the office today for routine follow up.   Hypothyroid- referred to endocrinologist who is managing thyroid. Currently on synthroid 50 mcg daily  Tachycardia- continues on propanolol- forgot today.  Has an apple watch and reports avg HR 88.  Continues to have palpitations which she has contributed to her thyroid disease.   htn- took norvasc today but forgot propranolol 114/81, 121/81 on home readings.  Busy day at work so far.   Having a lot of sinus issues, year long allergies, worse when weather changes. ears hurting, eyes itchy and watery, nose runny and stuffy. Taking benadryl at night and zyrtec during the day.  Ears hurt but no pain today Used Qtip to clean them.    Review of Systems:  Review of Systems  Constitutional: Negative for chills, fever and weight loss.  HENT: Negative for tinnitus.   Respiratory: Negative for cough, sputum production and shortness of breath.   Cardiovascular: Positive for palpitations. Negative for chest pain and leg swelling.  Gastrointestinal: Negative for abdominal pain, constipation, diarrhea and heartburn.  Genitourinary: Negative for dysuria, frequency and urgency.  Musculoskeletal: Negative for back pain, falls, joint pain and myalgias.  Skin: Negative.   Neurological: Negative for dizziness and  headaches.  Psychiatric/Behavioral: Negative for depression and memory loss. The patient is nervous/anxious (very busy, feeling overwhelmed at times.). The patient does not have insomnia.     Past Medical History:  Diagnosis Date  . Allergic rhinitis   . Lower back pain   . Thyroid disease    Past Surgical History:  Procedure Laterality Date  . HERNIA REPAIR  1989    Dr.Kathleen Samuel Bouche    Social History:   reports that she has been smoking cigars. She has never used smokeless tobacco. She reports current alcohol use. She reports that she does not use drugs.  Family History  Problem Relation Age of Onset  . Breast cancer Maternal Aunt   . Hypertension Mother     Medications: Patient's Medications  New Prescriptions   No medications on file  Previous Medications   AMLODIPINE (NORVASC) 10 MG TABLET    Take 1/2 (one-half) tablet by mouth once daily   DIPHENHYDRAMINE (BENADRYL) 25 MG TABLET    Take 25 mg by mouth as needed (Sinuses).   LEVOTHYROXINE (SYNTHROID) 50 MCG TABLET    Take 1 tablet (50 mcg total) by mouth daily.   PROPRANOLOL (INDERAL) 40 MG TABLET    Take 1 tablet (40 mg total) by mouth 2 (two) times daily.  Modified Medications   No medications on file  Discontinued Medications   AZITHROMYCIN (ZITHROMAX) 250 MG TABLET    2 tablets by mouth today then 1 daily until complete   BENZONATATE (TESSALON) 100 MG CAPSULE    Take 1 capsule by mouth every 8 (eight) hours for cough.    Physical Exam:  Vitals:   02/14/19 1314  BP: (!) 164/98  Pulse: (!) 105  Temp: 98.2 F (36.8 C)  TempSrc: Temporal  SpO2: 98%  Weight: 191 lb (86.6 kg)  Height: 5\' 6"  (1.676 m)   Body mass index is 30.83 kg/m. Wt Readings from Last 3 Encounters:  02/14/19 191 lb (86.6 kg)  06/17/18 186 lb 9.6 oz (84.6 kg)  06/04/18 189 lb 6.4 oz (85.9 kg)    Physical Exam Constitutional:      General: She is not in acute distress.    Appearance: She is well-developed. She is not diaphoretic.   HENT:     Head: Normocephalic and atraumatic.     Right Ear: Tympanic membrane normal.     Left Ear: Tympanic membrane normal.     Ears:     Comments: Canals slighty red bilaterally. No wax to ear canals.     Mouth/Throat:     Pharynx: No oropharyngeal exudate or posterior oropharyngeal erythema.  Eyes:     Conjunctiva/sclera: Conjunctivae normal.     Pupils: Pupils are equal, round, and reactive to light.  Neck:     Musculoskeletal: Normal range of motion and neck supple.  Cardiovascular:     Rate and Rhythm: Regular rhythm. Tachycardia present.     Heart sounds: Normal heart sounds.  Pulmonary:     Effort: Pulmonary effort is normal.     Breath sounds: Normal breath sounds.  Abdominal:     General: Bowel sounds are normal.     Palpations: Abdomen is soft.  Musculoskeletal:        General: No tenderness.  Skin:    General: Skin is warm and dry.  Neurological:     Mental Status: She is alert and oriented to person, place, and time.  Psychiatric:        Mood and Affect: Mood normal.        Behavior: Behavior normal.    Labs reviewed: Basic Metabolic Panel: Recent Labs    04/22/18 1145 09/09/18 0911  NA 139  --   K 4.3  --   CL 104  --   CO2 26  --   GLUCOSE 88  --   BUN 8  --   CREATININE 0.86  --   CALCIUM 9.7  --   TSH 6.29* 5.33*   Liver Function Tests: No results for input(s): AST, ALT, ALKPHOS, BILITOT, PROT, ALBUMIN in the last 8760 hours. No results for input(s): LIPASE, AMYLASE in the last 8760 hours. No results for input(s): AMMONIA in the last 8760 hours. CBC: Recent Labs    04/22/18 1145  WBC 4.8  NEUTROABS 2,357  HGB 12.4  HCT 35.6  MCV 91.0  PLT 293   Lipid Panel: No results for input(s): CHOL, HDL, LDLCALC, TRIG, CHOLHDL, LDLDIRECT in the last 8760 hours. TSH: Recent Labs    04/22/18 1145 09/09/18 0911  TSH 6.29* 5.33*   A1C: No results found for: HGBA1C   Assessment/Plan 1. Need for influnza vaccination - Flu Vaccine QUAD  6+ mos PF IM (Fluarix Quad PF)  2. Anxiety - may be contributing to palpitations. She plans to check HR on apple watch when she has episode.  -counseling and medication work best together -To make sure getting outdoors every day To increase physical activity, as little as 15 mins a day, does not have to be intense go on a short walk To start gratitude journal and write in this daily - citalopram (CELEXA) 10 MG tablet; Take 1  tablet (10 mg total) by mouth daily.  Dispense: 30 tablet; Refill: 1 -if she experiences adverse effects to notify office immediately.   3. Acquired hypothyroidism Followed with endocrinologist, she needs to make follow up appt where she reports she will get her thyroid function rechecked.   4. Migraine without status migrainosus, not intractable, unspecified migraine type Overall headaches have improved  5. Essential hypertension -reviewed bp readings from cuff which are much better controlled than in office readings. Will continue current regimen with dietary modifications encouraged. Encouraged medication compliance as she did not take propranolol prior to OV.  6. Tachycardia -has not taken her propanolol today. Will have her TSH checked with her endocrinologist, anxiety could also be contributing. Will have her check HR with her apple watch next time she has episode. If thyroid is controlled on current medication and HR is elevated would recommend cardiology referral for further evaluation   7. Seasonal allergies - to continue zyrtec daily -to use nasal wash/nettipot daily -To start flonase 1 spray into both nares twice daily after nasal wash or nettipot -Avoid qtips to/in ear  Next appt: 4 weeks for mood/palpitations.  Carlos American. [place], [facility] [phone]

## 2019-02-14 NOTE — Patient Instructions (Addendum)
To make sure you are getting outdoors every day To increase physical activity, as little as 15 mins a day, does not have to be intense go on a short walk To start gratitude journal and write in this daily  To start celexa 10 mg to take daily- if you start experiencing side effects to notify our office. If you start having feelings of hurting yourself or others to stop immediately.   Follow up in 1 month on mood  To make follow up with endocrinologist.   To start flonase 1 spray into both nares twice daily after nasal wash or nettipot Avoid qtips

## 2019-02-21 ENCOUNTER — Other Ambulatory Visit: Payer: Self-pay

## 2019-02-22 ENCOUNTER — Ambulatory Visit (INDEPENDENT_AMBULATORY_CARE_PROVIDER_SITE_OTHER): Payer: Medicaid Other | Admitting: Internal Medicine

## 2019-02-22 ENCOUNTER — Encounter: Payer: Self-pay | Admitting: Internal Medicine

## 2019-02-22 VITALS — BP 122/64 | HR 82 | Temp 98.2°F | Ht 66.0 in | Wt 192.8 lb

## 2019-02-22 DIAGNOSIS — E010 Iodine-deficiency related diffuse (endemic) goiter: Secondary | ICD-10-CM | POA: Insufficient documentation

## 2019-02-22 DIAGNOSIS — E039 Hypothyroidism, unspecified: Secondary | ICD-10-CM

## 2019-02-22 DIAGNOSIS — E890 Postprocedural hypothyroidism: Secondary | ICD-10-CM | POA: Insufficient documentation

## 2019-02-22 LAB — T4, FREE: Free T4: 0.76 ng/dL (ref 0.60–1.60)

## 2019-02-22 LAB — TSH: TSH: 6.66 u[IU]/mL — ABNORMAL HIGH (ref 0.35–4.50)

## 2019-02-22 NOTE — Progress Notes (Signed)
Name: Oriya Kettering  MRN/ DOB: [id number], 10/31/1985    Age/ Sex: 33 y.o., female     PCP: Lauree Chandler, NP   Reason for Endocrinology Evaluation: Hypothyroidism     Initial Endocrinology Clinic Visit: 06/04/2018    PATIENT IDENTIFIER: [name] is a 33 y.o., female with a past medical history of Graves' disease. She has followed with [facility] since 06/04/2018 for consultative assistance with management of her hypothyroidism.   HISTORICAL SUMMARY: The patient was first diagnosed with hyperthyroidism secondary to Graves' disease in 2015.  She was started on Methimazole that she took for 2 years but stopped due to lack of insurance. She presented again to her PCP in 03/2018 for evaluation of headaches, during work up she was foun to have elevated TSH at 6.29 uIU/mL.  She was started on levothyroxine 25 MCG daily by her PCP. Paternal aunt with thyroid disease.  SUBJECTIVE:   During last visit (07/30/2018): INcreased  Levothyroxine to 50 mg daily.  Today (02/22/2019):  Ms. Bushee is here for a follow up on hypothyroidism.  She continues to take levothyroxine on daily basis. She takes it correctly. Her weight has increased since last visit. . She has chronic constipation and this has not changed.   Denies depression , but has anxiety.   She takes prenatal after dinner   Has occasional neck soreness.   Has heat intolerance with palpitations which is chronic     LMP last week    ROS:  As per HPI.   HISTORY:  Past Medical History:  Past Medical History:  Diagnosis Date  . Allergic rhinitis   . Lower back pain   . Thyroid disease    Past Surgical History:  Past Surgical History:  Procedure Laterality Date  . [place]     Social History:  reports that she has been smoking cigars. She has never used smokeless tobacco. She reports current alcohol use. She reports that she does not use drugs. Family History:   Family History  Problem Relation Age of Onset  . Breast cancer Maternal Aunt   . Hypertension Mother      HOME MEDICATIONS: Allergies as of 02/22/2019   No Known Allergies     Medication List       Accurate as of February 22, 2019 12:26 PM. If you have any questions, ask your nurse or doctor.        amLODipine 10 MG tablet Commonly known as: NORVASC Take 1/2 (one-half) tablet by mouth once daily   citalopram 10 MG tablet Commonly known as: CeleXA Take 1 tablet (10 mg total) by mouth daily.   diphenhydrAMINE 25 MG tablet Commonly known as: BENADRYL Take 25 mg by mouth as needed (Sinuses).   levothyroxine 50 MCG tablet Commonly known as: SYNTHROID Take 1 tablet (50 mcg total) by mouth daily.   propranolol 40 MG tablet Commonly known as: INDERAL Take 1 tablet (40 mg total) by mouth 2 (two) times daily.      PHYSICAL EXAM: VS: BP 122/64 (BP Location: Left Arm, Patient Position: Sitting, Cuff Size: Large)   Pulse 82   Temp 98.2 F (36.8 C)   Ht 5\' 6"  (1.676 m)   Wt 192 lb 12.8 oz (87.5 kg)   SpO2 98%   BMI 31.12 kg/m    EXAM: General: Pt appears well and is in NAD  Eyes: External eye exam normal without stare, lid lag or  exophthalmos.  EOM intact.    Neck: General: Supple without adenopathy. Thyroid: Thyroid asymmetrical R>L .  Lungs: Clear with good BS bilat with no rales, rhonchi, or wheezes  Heart: Auscultation: RRR with normal S1 and S2, no gallops or murmurs  Abdomen: Normoactive bowel sounds, soft, nontender  Extremities: BL LE: no pretibial edema   Mental Status: Judgment, insight: intact Orientation: oriented to time, place, and person Mood and affect: no depression, anxiety, or agitation     DATA REVIEWED: Results for ANNASTACIA, DUBA (MRN [id number]) as of 02/23/2019 11:21  Ref. Range 06/10/2017 14:01 04/22/2018 11:45 09/09/2018 09:11 02/22/2019 14:49  TSH Latest Ref Range: 0.35 - 4.50 uIU/mL 1.791 6.29 (H) 5.33 (H) 6.66 (H)   Triiodothyronine,Free,Serum Latest Ref Range: 2.3 - 4.2 pg/mL  3.0    T4,Free(Direct) Latest Ref Range: 0.60 - 1.60 ng/dL 3.26 0.9 7.12 4.58    ASSESSMENT / PLAN / RECOMMENDATIONS:   1. Hypothyroidism:    Clinically euthyroid  Pt educated extensively on the correct way to take levothyroxine (first thing in the morning with water, 30 minutes before eating or taking other medications).  Pt encouraged to double dose the following day if she were to miss a dose given long half-life of levothyroxine.  Will order a thyroid ultrasound for thyroid asymmetry on exam   Medications  Stop Levothyroxine 50 mcg Start levothyroxine 75 mcg daily     F/u in 1 yr   Signed electronically by: Lyndle Herrlich, MD  [facility]  [facility] [address] [place] [place], [place] [address] Phone: [phone] FAX: [phone]   CC: Sharon Seller, NP [address] [place] [address] Phone: [phone] Fax: [phone]   Return to Endocrinology clinic as below: Future Appointments  Date Time Provider Department Center  02/22/2019  2:20 PM Kyden Potash, Konrad Dolores, MD [facility] None  03/18/2019  1:00 PM Sharon Seller, NP [facility] None

## 2019-02-22 NOTE — Patient Instructions (Addendum)
You are on levothyroxine - which is your thyroid hormone supplement. You MUST take this consistently.  You should take this first thing in the morning on an empty stomach with water. You should not take it with other medications. Wait 43min to 1hr prior to eating. If you are taking any vitamins - please take these in the evening.   If you miss a dose, please take your missed dose the following day (double the dose for that day). You should have a pill box for ONLY levothyroxine on your bedside table to help you remember to take your medications.    - I have ordered a thyroid ultrasound, please contact us if you don't hear about it in 3 weeks

## 2019-02-23 ENCOUNTER — Encounter: Payer: Self-pay | Admitting: Internal Medicine

## 2019-02-23 ENCOUNTER — Telehealth: Payer: Self-pay | Admitting: Internal Medicine

## 2019-02-23 MED ORDER — LEVOTHYROXINE SODIUM 75 MCG PO TABS: 75.0000 ug | ORAL_TABLET | Freq: Every day | ORAL | 3 refills | Status: DC

## 2019-02-23 NOTE — Telephone Encounter (Signed)
Please let her know her thyroid is off, the 50 mcg is not  Enough, will increase levothyroxine to 75 mcg, she will need labs in 8 weeks   Please make sure she is not missing any pills.     Thanks   Abby Nena Jordan, MD  [facility]  [facility] [address] [place], [place] [address] Phone: [phone] FAX: [phone]

## 2019-02-23 NOTE — Telephone Encounter (Signed)
lft vm to return call 

## 2019-02-28 NOTE — Telephone Encounter (Signed)
Pt informed

## 2019-03-11 ENCOUNTER — Ambulatory Visit
Admission: RE | Admit: 2019-03-11 | Discharge: 2019-03-11 | Disposition: A | Discharge status: Home / Self Care | Payer: Medicaid Other | Source: Ambulatory Visit | Attending: Internal Medicine | Admitting: Internal Medicine

## 2019-03-11 DIAGNOSIS — E010 Iodine-deficiency related diffuse (endemic) goiter: Secondary | ICD-10-CM

## 2019-03-18 ENCOUNTER — Encounter: Payer: Self-pay | Admitting: Nurse Practitioner

## 2019-03-18 ENCOUNTER — Other Ambulatory Visit: Payer: Self-pay

## 2019-03-18 ENCOUNTER — Telehealth (INDEPENDENT_AMBULATORY_CARE_PROVIDER_SITE_OTHER): Payer: Self-pay | Admitting: Nurse Practitioner

## 2019-03-18 DIAGNOSIS — F419 Anxiety disorder, unspecified: Secondary | ICD-10-CM

## 2019-03-18 DIAGNOSIS — E890 Postprocedural hypothyroidism: Secondary | ICD-10-CM

## 2019-03-18 DIAGNOSIS — R002 Palpitations: Secondary | ICD-10-CM

## 2019-03-18 NOTE — Patient Instructions (Signed)
Continue on celexa 10 mg daily, if after 4 more weeks do not feel much benefit and continuing with uncontrolled anxiety may increase to 20 mg daily- to notify office so we can update your prescription.

## 2019-03-18 NOTE — Progress Notes (Signed)
   This service is provided via telemedicine  No vital signs collected/recorded due to the encounter was a telemedicine visit.   Location of patient (ex: home, work):  Home  Patient consents to a telephone/virtual visit:  Yes   Location of the provider (ex: office, home):  [facility], Office   Name of any referring provider: N/A  Names of all persons participating in the telemedicine service and their role in the encounter: S.Chrae B/CMA, Sherrie Mustache, NP, and Patient   Time spent on call:  8 min with medical assistant

## 2019-03-18 NOTE — Progress Notes (Signed)
Careteam: Patient Care Team: Mariah Seller, Mariah Rocha as PCP - General (Geriatric Medicine) Oneal Grout, MD (Inactive) as Consulting Physician (Internal Medicine)  Advanced Directive information    No Known Allergies  Chief Complaint  Patient presents with  . Follow-up    1 month follow-up on mood. No change in mood with new medication. Virtual Visit      HPI: Patient is a 33 y.o. female seen in the office today for follow up on mood.   Anxiety- makes her a little sleepy so takes a night, otherwise no side effects. Does not feel like it is working as well as it should but seeing some benefit. No thoughts of Si or Hi. No worsening anxiety or depression.   Got insurance- went and got obamacare which will be in effect next year.  Palpitations- following on apple watch, overall better HR now is 86. Last episode was maybe 2 weeks ago HR was 99-100.   Hypothyroid- recently increase to 75 mcg from 50 mcg.   Review of Systems:  Review of Systems  Constitutional: Negative for chills and fever.  Respiratory: Negative for shortness of breath.   Cardiovascular: Positive for palpitations (improved). Negative for chest pain.  Psychiatric/Behavioral: Negative for depression, hallucinations, substance abuse and suicidal ideas. The patient is nervous/anxious. The patient does not have insomnia.     Past Medical History:  Diagnosis Date  . Allergic rhinitis   . Lower back pain   . Thyroid disease    Past Surgical History:  Procedure Laterality Date  . HERNIA REPAIR  1989    Dr.Kathleen Samuel Bouche    Social History:   reports that she has been smoking cigars. She has never used smokeless tobacco. She reports current alcohol use. She reports that she does not use drugs.  Family History  Problem Relation Age of Onset  . Breast cancer Maternal Aunt   . Hypertension Mother     Medications: Patient's Medications  New Prescriptions   No medications on file  Previous Medications   AMLODIPINE (NORVASC) 10 MG TABLET    Take 1/2 (one-half) tablet by mouth once daily   CITALOPRAM (CELEXA) 10 MG TABLET    Take 1 tablet (10 mg total) by mouth daily.   DIPHENHYDRAMINE (BENADRYL) 25 MG TABLET    Take 25 mg by mouth as needed (Sinuses).   LEVOTHYROXINE (SYNTHROID) 75 MCG TABLET    Take 1 tablet (75 mcg total) by mouth daily.   PROPRANOLOL (INDERAL) 40 MG TABLET    Take 1 tablet (40 mg total) by mouth 2 (two) times daily.  Modified Medications   No medications on file  Discontinued Medications   No medications on file    Physical Exam:  There were no vitals filed for this visit. There is no height or weight on file to calculate BMI. Wt Readings from Last 3 Encounters:  02/22/19 192 lb 12.8 oz (87.5 kg)  02/14/19 191 lb (86.6 kg)  06/17/18 186 lb 9.6 oz (84.6 kg)      Labs reviewed: Basic Metabolic Panel: Recent Labs    04/22/18 1145 09/09/18 0911 02/22/19 1449  NA 139  --   --   K 4.3  --   --   CL 104  --   --   CO2 26  --   --   GLUCOSE 88  --   --   BUN 8  --   --   CREATININE 0.86  --   --  CALCIUM 9.7  --   --   TSH 6.29* 5.33* 6.66*   Liver Function Tests: No results for input(s): AST, ALT, ALKPHOS, BILITOT, PROT, ALBUMIN in the last 8760 hours. No results for input(s): LIPASE, AMYLASE in the last 8760 hours. No results for input(s): AMMONIA in the last 8760 hours. CBC: Recent Labs    04/22/18 1145  WBC 4.8  NEUTROABS 2,357  HGB 12.4  HCT 35.6  MCV 91.0  PLT 293   Lipid Panel: No results for input(s): CHOL, HDL, LDLCALC, TRIG, CHOLHDL, LDLDIRECT in the last 8760 hours. TSH: Recent Labs    04/22/18 1145 09/09/18 0911 02/22/19 1449  TSH 6.29* 5.33* 6.66*   A1C: No results found for: HGBA1C   Assessment/Plan 1. Postablative hypothyroidism -ongoing follow up by endocrinologist, last TSH 6.66 with increase in synthroid from 50 to 75 mcg.   2. Anxiety -improved, will have her continue on celexa 10 mg daily for another 4 weeks.  Discuss that we can increase to 20 mg after that if she feels ongoing uncontrolled anxiety. She did recently have an adjustment in thyroid medication so with the improvement of TSH anxiety may improve as well. If she would like to increase celexa to 20 mg will notify office so we can update Rx.  3. Palpitations -improved at this time. Last episode was a month ago with HR 100.   Next appt: 5 months for routine follow up [place]. Harle Battiest  [facility] [phone]   Virtual Visit via Video Note  I connected with Mariah Rocha on 03/18/19 at  1:00 PM EST by a video enabled telemedicine application and verified that I am speaking with the correct person using two identifiers.  Location: Patient: home Provider: work   I discussed the limitations of evaluation and management by telemedicine and the availability of in person appointments. The patient expressed understanding and agreed to proceed.    I discussed the assessment and treatment plan with the patient. The patient was provided an opportunity to ask questions and all were answered. The patient agreed with the plan and demonstrated an understanding of the instructions.   The patient was advised to call back or seek an in-person evaluation if the symptoms worsen or if the condition fails to improve as anticipated.  I provided  10 minutes of non-face-to-face time during this encounter.  Carlos American. Dewaine Oats, AGNP Avs printed and mailed.

## 2019-04-13 ENCOUNTER — Other Ambulatory Visit: Payer: Self-pay

## 2019-04-13 ENCOUNTER — Other Ambulatory Visit (INDEPENDENT_AMBULATORY_CARE_PROVIDER_SITE_OTHER): Payer: Medicaid Other

## 2019-04-13 DIAGNOSIS — E039 Hypothyroidism, unspecified: Secondary | ICD-10-CM

## 2019-04-13 LAB — T4, FREE: Free T4: 0.9 ng/dL (ref 0.60–1.60)

## 2019-04-13 LAB — TSH: TSH: 2.39 u[IU]/mL (ref 0.35–4.50)

## 2019-04-14 MED ORDER — LEVOTHYROXINE SODIUM 75 MCG PO TABS: 75.0000 ug | ORAL_TABLET | Freq: Every day | ORAL | 3 refills | Status: DC

## 2019-05-02 ENCOUNTER — Other Ambulatory Visit: Payer: Self-pay | Admitting: Nurse Practitioner

## 2019-05-02 DIAGNOSIS — I10 Essential (primary) hypertension: Secondary | ICD-10-CM

## 2019-05-02 DIAGNOSIS — E0590 Thyrotoxicosis, unspecified without thyrotoxic crisis or storm: Secondary | ICD-10-CM

## 2019-05-02 DIAGNOSIS — G43909 Migraine, unspecified, not intractable, without status migrainosus: Secondary | ICD-10-CM

## 2019-08-22 ENCOUNTER — Ambulatory Visit (INDEPENDENT_AMBULATORY_CARE_PROVIDER_SITE_OTHER): Payer: 59 | Admitting: Nurse Practitioner

## 2019-08-22 ENCOUNTER — Encounter: Payer: Self-pay | Admitting: Nurse Practitioner

## 2019-08-22 ENCOUNTER — Other Ambulatory Visit: Payer: Self-pay

## 2019-08-22 VITALS — BP 118/72 | HR 75 | Temp 97.1°F | Ht 66.0 in | Wt 191.6 lb

## 2019-08-22 DIAGNOSIS — E890 Postprocedural hypothyroidism: Secondary | ICD-10-CM

## 2019-08-22 DIAGNOSIS — R000 Tachycardia, unspecified: Secondary | ICD-10-CM | POA: Diagnosis not present

## 2019-08-22 DIAGNOSIS — G43909 Migraine, unspecified, not intractable, without status migrainosus: Secondary | ICD-10-CM | POA: Diagnosis not present

## 2019-08-22 DIAGNOSIS — I10 Essential (primary) hypertension: Secondary | ICD-10-CM | POA: Diagnosis not present

## 2019-08-22 DIAGNOSIS — E782 Mixed hyperlipidemia: Secondary | ICD-10-CM

## 2019-08-22 MED ORDER — SUMATRIPTAN SUCCINATE 50 MG PO TABS: 50.0000 mg | ORAL_TABLET | Freq: Every day | ORAL | 2 refills | Status: DC | PRN

## 2019-08-22 NOTE — Patient Instructions (Signed)
To sign record release from GYN so we can make sure to have your updated records  To use sumatriptan 50 mg daily as needed for headache, can repeat dose x 1  in 2 hours if needed  If headaches remain uncontrolled to notify and we will refer to headache clinic  Schedule fasting labs this week or next  Follow up in 6 months for routine follow up

## 2019-08-22 NOTE — Progress Notes (Signed)
Careteam: Patient Care Team: Lauree Chandler, NP as PCP - General (Geriatric Medicine) Blanchie Serve, MD (Inactive) as Consulting Physician (Internal Medicine)  PLACE OF SERVICE:  [place] Directive information Does Patient Have a Medical Advance Directive?: No, Would patient like information on creating a medical advance directive?: No - Patient declined  No Known Allergies  Chief Complaint  Patient presents with  . Medical Management of Chronic Issues    5 month follow-up  . Quality Metric Gaps    Discuss need for HIV screening and Pap   . Immunizations    Covid 19 vaccine      HPI: Patient is a 34 y.o. female for routine follow up/  Reports went to GYN in March, got blood work and PAP normal  COVID vaccine -April 1st, April 26, pfizer vaccine    htn- blood pressure stable today.   Hypothyroid- yearly follow up with endocrinology, maintained on synthroid 75 mcg  Headaches- overall better- occasional migraine, maybe once weekly.  No known triggers. Will have light sensitivity. Not using medication for headache. Will use a cold rag and sleep to manage. Feels like she has a band around her head. Saw neurology in the past for headaches and the trigger point injections did not work. Will get dizziness and lightheadedness when she has acute migraine.   Has been more active walking every other day.   Review of Systems:  Review of Systems  Constitutional: Negative for chills, fever and weight loss.  HENT: Negative for tinnitus.   Respiratory: Negative for cough, sputum production and shortness of breath.   Cardiovascular: Negative for chest pain, palpitations and leg swelling.  Gastrointestinal: Negative for abdominal pain, constipation, diarrhea and heartburn.  Genitourinary: Negative for dysuria, frequency and urgency.  Musculoskeletal: Negative for back pain, falls, joint pain and myalgias.  Skin: Negative.   Neurological: Positive for headaches.  Negative for dizziness.  Psychiatric/Behavioral: Negative for depression and memory loss. The patient does not have insomnia.     Past Medical History:  Diagnosis Date  . Allergic rhinitis   . Lower back pain   . Thyroid disease    Past Surgical History:  Procedure Laterality Date  . [place]    Social History:   reports that she has been smoking cigars. She has never used smokeless tobacco. She reports current alcohol use. She reports that she does not use drugs.  Family History  Problem Relation Age of Onset  . Breast cancer Maternal Aunt   . Hypertension Mother     Medications: Patient's Medications  New Prescriptions   No medications on file  Previous Medications   AMLODIPINE (NORVASC) 10 MG TABLET    Take 1/2 (one-half) tablet by mouth once daily   CITALOPRAM (CELEXA) 10 MG TABLET    Take 1 tablet (10 mg total) by mouth daily.   DIPHENHYDRAMINE (BENADRYL) 25 MG TABLET    Take 25 mg by mouth as needed (Sinuses).   IBUPROFEN (ADVIL) 200 MG TABLET    Take 200 mg by mouth as needed (cramping).   LEVOTHYROXINE (SYNTHROID) 75 MCG TABLET    Take 1 tablet (75 mcg total) by mouth daily.   PROPRANOLOL (INDERAL) 40 MG TABLET    Take 1 tablet by mouth twice daily  Modified Medications   No medications on file  Discontinued Medications   No medications on file    Physical Exam:  Vitals:   08/22/19 1100  BP: 118/72  Pulse: 75  Temp: (!) 97.1 F (36.2 C)  TempSrc: Temporal  SpO2: 99%  Weight: 191 lb 9.6 oz (86.9 kg)  Height: '5\' 6"'  (1.676 m)   Body mass index is 30.93 kg/m. Wt Readings from Last 3 Encounters:  08/22/19 191 lb 9.6 oz (86.9 kg)  02/22/19 192 lb 12.8 oz (87.5 kg)  02/14/19 191 lb (86.6 kg)    Physical Exam Constitutional:      General: She is not in acute distress.    Appearance: She is well-developed. She is not diaphoretic.  HENT:     Head: Normocephalic and atraumatic.     Mouth/Throat:     Pharynx: No  oropharyngeal exudate.  Eyes:     Conjunctiva/sclera: Conjunctivae normal.     Pupils: Pupils are equal, round, and reactive to light.  Cardiovascular:     Rate and Rhythm: Normal rate and regular rhythm.     Heart sounds: Normal heart sounds.  Pulmonary:     Effort: Pulmonary effort is normal.     Breath sounds: Normal breath sounds.  Abdominal:     General: Bowel sounds are normal.     Palpations: Abdomen is soft.  Musculoskeletal:        General: No tenderness.     Cervical back: Normal range of motion and neck supple.  Skin:    General: Skin is warm and dry.  Neurological:     General: No focal deficit present.     Mental Status: She is alert and oriented to person, place, and time.     Motor: No weakness.     Gait: Gait normal.  Psychiatric:        Mood and Affect: Mood normal.        Behavior: Behavior normal.    Labs reviewed: Basic Metabolic Panel: Recent Labs    09/09/18 0911 02/22/19 1449 04/13/19 1121  TSH 5.33* 6.66* 2.39   Liver Function Tests: No results for input(s): AST, ALT, ALKPHOS, BILITOT, PROT, ALBUMIN in the last 8760 hours. No results for input(s): LIPASE, AMYLASE in the last 8760 hours. No results for input(s): AMMONIA in the last 8760 hours. CBC: No results for input(s): WBC, NEUTROABS, HGB, HCT, MCV, PLT in the last 8760 hours. Lipid Panel: No results for input(s): CHOL, HDL, LDLCALC, TRIG, CHOLHDL, LDLDIRECT in the last 8760 hours. TSH: Recent Labs    09/09/18 0911 02/22/19 1449 04/13/19 1121  TSH 5.33* 6.66* 2.39   A1C: No results found for: HGBA1C   Assessment/Plan 1. Postablative hypothyroidism -stable, followed by endocrinologist, continues on synthroid 75 mcg daily  2. Migraine without status migrainosus, not intractable, unspecified migraine type Overall headaches have improved since on propranolol but still will have breakthrough, will start imitrex PRN. - SUMAtriptan (IMITREX) 50 MG tablet; Take 1 tablet (50 mg total)  by mouth daily as needed for migraine. May repeat in 2 hours if headache persists or recurs.  Dispense: 20 tablet; Refill: 2  3. Essential hypertension -improved on current regimen - CMP with eGFR([facility]); Future - CBC with Differential/Platelet; Future  4. Tachycardia Improved, without recurrent palpitations on propranolol  5. Mixed hyperlipidemia -will follow up labs, dietary modifications encouraged.  - Lipid panel; Future - CMP with eGFR([facility]); Future  Next appt: 6 months, sooner if needed  Jessica K. [place], [facility] [phone]

## 2019-08-23 ENCOUNTER — Other Ambulatory Visit: Payer: 59

## 2019-08-23 DIAGNOSIS — E782 Mixed hyperlipidemia: Secondary | ICD-10-CM

## 2019-08-23 DIAGNOSIS — I10 Essential (primary) hypertension: Secondary | ICD-10-CM

## 2019-08-23 LAB — COMPLETE METABOLIC PANEL WITH GFR
AG Ratio: 1.5 (calc) (ref 1.0–2.5)
ALT: 13 U/L (ref 6–29)
AST: 17 U/L (ref 10–30)
Albumin: 4.1 g/dL (ref 3.6–5.1)
Alkaline phosphatase (APISO): 42 U/L (ref 31–125)
BUN: 11 mg/dL (ref 7–25)
CO2: 23 mmol/L (ref 20–32)
Calcium: 8.7 mg/dL (ref 8.6–10.2)
Chloride: 109 mmol/L (ref 98–110)
Creat: 0.83 mg/dL (ref 0.50–1.10)
GFR, Est African American: 108 mL/min/{1.73_m2} (ref >=60)
GFR, Est Non African American: 93 mL/min/{1.73_m2} (ref >=60)
Globulin: 2.8 g/dL (calc) (ref 1.9–3.7)
Glucose, Bld: 97 mg/dL (ref 65–99)
Potassium: 4 mmol/L (ref 3.5–5.3)
Sodium: 139 mmol/L (ref 135–146)
Total Bilirubin: 0.3 mg/dL (ref 0.2–1.2)
Total Protein: 6.9 g/dL (ref 6.1–8.1)

## 2019-08-23 LAB — LIPID PANEL
Cholesterol: 176 mg/dL (ref <200)
HDL: 53 mg/dL (ref >=50)
LDL Cholesterol (Calc): 108 mg/dL (calc) — ABNORMAL HIGH
Non-HDL Cholesterol (Calc): 123 mg/dL (calc) (ref <130)
Total CHOL/HDL Ratio: 3.3 (calc) (ref <5.0)
Triglycerides: 60 mg/dL (ref <150)

## 2019-08-23 LAB — CBC WITH DIFFERENTIAL/PLATELET
Absolute Monocytes: 392 cells/uL (ref 200–950)
Basophils Absolute: 29 cells/uL (ref 0–200)
Basophils Relative: 0.6 %
Eosinophils Absolute: 201 cells/uL (ref 15–500)
Eosinophils Relative: 4.1 %
HCT: 34.3 % — ABNORMAL LOW (ref 35.0–45.0)
Hemoglobin: 11.8 g/dL (ref 11.7–15.5)
Lymphs Abs: 1896 cells/uL (ref 850–3900)
MCH: 32 pg (ref 27.0–33.0)
MCHC: 34.4 g/dL (ref 32.0–36.0)
MCV: 93 fL (ref 80.0–100.0)
MPV: 10.8 fL (ref 7.5–12.5)
Monocytes Relative: 8 %
Neutro Abs: 2381 cells/uL (ref 1500–7800)
Neutrophils Relative %: 48.6 %
Platelets: 270 10*3/uL (ref 140–400)
RBC: 3.69 10*6/uL — ABNORMAL LOW (ref 3.80–5.10)
RDW: 12.6 % (ref 11.0–15.0)
Total Lymphocyte: 38.7 %
WBC: 4.9 10*3/uL (ref 3.8–10.8)

## 2019-08-24 NOTE — Progress Notes (Signed)
She's becoming gradually anemic Cholesterol is above goal of less than 100 Electrolytes, liver and kidneys are ok Shanda Bumps can determine next steps when she returns

## 2019-08-30 ENCOUNTER — Other Ambulatory Visit: Payer: Self-pay

## 2019-08-30 DIAGNOSIS — I10 Essential (primary) hypertension: Secondary | ICD-10-CM

## 2019-08-30 DIAGNOSIS — E782 Mixed hyperlipidemia: Secondary | ICD-10-CM

## 2019-09-05 ENCOUNTER — Encounter: Payer: Self-pay | Admitting: Nurse Practitioner

## 2019-09-05 DIAGNOSIS — G43519 Persistent migraine aura without cerebral infarction, intractable, without status migrainosus: Secondary | ICD-10-CM

## 2019-11-30 ENCOUNTER — Other Ambulatory Visit: Payer: Self-pay | Admitting: Nurse Practitioner

## 2019-11-30 DIAGNOSIS — I10 Essential (primary) hypertension: Secondary | ICD-10-CM

## 2020-02-15 ENCOUNTER — Ambulatory Visit: Payer: Medicaid Other | Admitting: Internal Medicine

## 2020-02-16 ENCOUNTER — Ambulatory Visit: Payer: Medicaid Other | Admitting: Internal Medicine

## 2020-02-17 ENCOUNTER — Other Ambulatory Visit: Payer: Self-pay

## 2020-02-20 ENCOUNTER — Ambulatory Visit: Payer: 59 | Admitting: Nurse Practitioner

## 2020-02-22 ENCOUNTER — Other Ambulatory Visit: Payer: Self-pay

## 2020-02-22 ENCOUNTER — Other Ambulatory Visit: Payer: Medicaid Other

## 2020-02-22 DIAGNOSIS — E782 Mixed hyperlipidemia: Secondary | ICD-10-CM

## 2020-02-22 DIAGNOSIS — I10 Essential (primary) hypertension: Secondary | ICD-10-CM

## 2020-02-23 LAB — CBC WITH DIFFERENTIAL/PLATELET
Absolute Monocytes: 480 cells/uL (ref 200–950)
Basophils Absolute: 29 cells/uL (ref 0–200)
Basophils Relative: 0.6 %
Eosinophils Absolute: 181 cells/uL (ref 15–500)
Eosinophils Relative: 3.7 %
HCT: 35.1 % (ref 35.0–45.0)
Hemoglobin: 12 g/dL (ref 11.7–15.5)
Lymphs Abs: 1950 cells/uL (ref 850–3900)
MCH: 31.7 pg (ref 27.0–33.0)
MCHC: 34.2 g/dL (ref 32.0–36.0)
MCV: 92.9 fL (ref 80.0–100.0)
MPV: 11 fL (ref 7.5–12.5)
Monocytes Relative: 9.8 %
Neutro Abs: 2259 cells/uL (ref 1500–7800)
Neutrophils Relative %: 46.1 %
Platelets: 288 10*3/uL (ref 140–400)
RBC: 3.78 10*6/uL — ABNORMAL LOW (ref 3.80–5.10)
RDW: 12.6 % (ref 11.0–15.0)
Total Lymphocyte: 39.8 %
WBC: 4.9 10*3/uL (ref 3.8–10.8)

## 2020-02-23 LAB — COMPLETE METABOLIC PANEL WITH GFR
AG Ratio: 1.5 (calc) (ref 1.0–2.5)
ALT: 12 U/L (ref 6–29)
AST: 15 U/L (ref 10–30)
Albumin: 4.3 g/dL (ref 3.6–5.1)
Alkaline phosphatase (APISO): 39 U/L (ref 31–125)
BUN: 9 mg/dL (ref 7–25)
CO2: 25 mmol/L (ref 20–32)
Calcium: 9.1 mg/dL (ref 8.6–10.2)
Chloride: 106 mmol/L (ref 98–110)
Creat: 0.77 mg/dL (ref 0.50–1.10)
GFR, Est African American: 118 mL/min/{1.73_m2} (ref >=60)
GFR, Est Non African American: 102 mL/min/{1.73_m2} (ref >=60)
Globulin: 2.9 g/dL (calc) (ref 1.9–3.7)
Glucose, Bld: 93 mg/dL (ref 65–99)
Potassium: 3.9 mmol/L (ref 3.5–5.3)
Sodium: 138 mmol/L (ref 135–146)
Total Bilirubin: 0.5 mg/dL (ref 0.2–1.2)
Total Protein: 7.2 g/dL (ref 6.1–8.1)

## 2020-02-23 LAB — LIPID PANEL
Cholesterol: 175 mg/dL (ref <200)
HDL: 58 mg/dL (ref >=50)
LDL Cholesterol (Calc): 102 mg/dL (calc) — ABNORMAL HIGH
Non-HDL Cholesterol (Calc): 117 mg/dL (calc) (ref <130)
Total CHOL/HDL Ratio: 3 (calc) (ref <5.0)
Triglycerides: 68 mg/dL (ref <150)

## 2020-02-29 ENCOUNTER — Ambulatory Visit: Payer: BC Managed Care – PPO | Admitting: Internal Medicine

## 2020-02-29 ENCOUNTER — Other Ambulatory Visit: Payer: Self-pay

## 2020-02-29 VITALS — BP 124/84 | HR 72 | Ht 66.0 in | Wt 192.0 lb

## 2020-02-29 DIAGNOSIS — E039 Hypothyroidism, unspecified: Secondary | ICD-10-CM | POA: Diagnosis not present

## 2020-02-29 LAB — TSH: TSH: 3.75 u[IU]/mL (ref 0.35–4.50)

## 2020-02-29 NOTE — Progress Notes (Signed)
Name: Mariah Rocha  MRN/ DOB: [id number], 1985-04-03    Age/ Sex: 34 y.o., female     PCP: Sharon Seller, NP   Reason for Endocrinology Evaluation: Hypothyroidism     Initial Endocrinology Clinic Visit: 06/04/2018    PATIENT IDENTIFIER: Mariah Rocha is a 34 y.o., female with a past medical history of Graves' disease. She has followed with [facility] since 06/04/2018 for consultative assistance with management of her hypothyroidism.   HISTORICAL SUMMARY: The patient was first diagnosed with hyperthyroidism secondary to Graves' disease in 2015.  She was started on Methimazole that she took for 2 years but stopped due to lack of insurance. She presented again to her PCP in 03/2018 for evaluation of headaches, during work up she was foun to have elevated TSH at 6.29 uIU/mL.  She was started on levothyroxine 25 MCG daily by her PCP.   Paternal aunt with thyroid disease.  SUBJECTIVE:     Today (02/29/2020):  Mariah Rocha is here for a follow up on hypothyroidism.  She continues to take levothyroxine on daily basis.  Has occasional constipation  Denies depression   Has been more tired then usual  Does not sleep well at night   Has occasional neck soreness.No local neck symptoms    LMP last week      HISTORY:  Past Medical History:  Past Medical History:  Diagnosis Date  . Allergic rhinitis   . Lower back pain   . Thyroid disease    Past Surgical History:  Past Surgical History:  Procedure Laterality Date  . HERNIA REPAIR  1989    Dr.Kathleen Samuel Bouche     Social History:  reports that she has been smoking cigars. She has never used smokeless tobacco. She reports current alcohol use. She reports that she does not use drugs. Family History:  Family History  Problem Relation Age of Onset  . Breast cancer Maternal Aunt   . Hypertension Mother      HOME MEDICATIONS: Allergies as of 02/29/2020   No Known Allergies     Medication List         Accurate as of February 29, 2020  3:56 PM. If you have any questions, ask your nurse or doctor.        amLODipine 10 MG tablet Commonly known as: NORVASC Take 1/2 (one-half) tablet by mouth once daily   citalopram 10 MG tablet Commonly known as: CeleXA Take 1 tablet (10 mg total) by mouth daily.   diphenhydrAMINE 25 MG tablet Commonly known as: BENADRYL Take 25 mg by mouth as needed (Sinuses).   ibuprofen 200 MG tablet Commonly known as: ADVIL Take 200 mg by mouth as needed (cramping).   levothyroxine 75 MCG tablet Commonly known as: SYNTHROID Take 1 tablet (75 mcg total) by mouth daily.   propranolol 40 MG tablet Commonly known as: INDERAL Take 1 tablet by mouth twice daily   SUMAtriptan 50 MG tablet Commonly known as: Imitrex Take 1 tablet (50 mg total) by mouth daily as needed for migraine. May repeat in 2 hours if headache persists or recurs.      PHYSICAL EXAM: VS: BP 124/84   Pulse 72   Ht 5\' 6"  (1.676 m)   Wt 192 lb (87.1 kg)   LMP 02/28/2020   SpO2 99%   BMI 30.99 kg/m    EXAM: General: Pt appears well and is in NAD  Eyes: External eye exam normal without stare, lid lag or exophthalmos.  EOM intact.    Neck: General: Supple without adenopathy. Thyroid: Thyroid asymmetrical R>L .  Lungs: Clear with good BS bilat with no rales, rhonchi, or wheezes  Heart: Auscultation: RRR with normal S1 and S2, no gallops or murmurs  Abdomen: Normoactive bowel sounds, soft, nontender  Extremities: BL LE: no pretibial edema   Mental Status: Judgment, insight: intact Orientation: oriented to time, place, and person Mood and affect: no depression, anxiety, or agitation     DATA REVIEWED: Results for ZANIYA, MCAULAY (MRN [id number]) as of 03/01/2020 09:08  Ref. Range 02/29/2020 16:05  TSH Latest Ref Range: 0.35 - 4.50 uIU/mL 3.75    ASSESSMENT / PLAN / RECOMMENDATIONS:   1. Hypothyroidism:    Clinically euthyroid   No local neck symptoms   TSH within normal  range, no changes   Medications   Continue levothyroxine 75 mcg daily     F/u in 1 yr   Signed electronically by: Lyndle Herrlich, MD  [facility]  [facility] [address] [place], [place] [address] Phone: [phone] FAX: [phone]   CC: Sharon Seller, NP [address] [place] [address] Phone: [phone] Fax: [phone]   Return to Endocrinology clinic as below: Future Appointments  Date Time Provider Department Center  03/02/2020  3:45 PM Sharon Seller, NP [facility] None

## 2020-03-01 MED ORDER — LEVOTHYROXINE SODIUM 75 MCG PO TABS: 75.0000 ug | ORAL_TABLET | Freq: Every day | ORAL | 3 refills | Status: DC

## 2020-03-02 ENCOUNTER — Ambulatory Visit (INDEPENDENT_AMBULATORY_CARE_PROVIDER_SITE_OTHER): Payer: BC Managed Care – PPO | Admitting: Nurse Practitioner

## 2020-03-02 ENCOUNTER — Encounter: Payer: Self-pay | Admitting: Nurse Practitioner

## 2020-03-02 ENCOUNTER — Other Ambulatory Visit: Payer: Self-pay

## 2020-03-02 VITALS — BP 130/90 | HR 69 | Temp 97.2°F | Ht 66.0 in | Wt 192.0 lb

## 2020-03-02 DIAGNOSIS — G43519 Persistent migraine aura without cerebral infarction, intractable, without status migrainosus: Secondary | ICD-10-CM | POA: Diagnosis not present

## 2020-03-02 DIAGNOSIS — Z23 Encounter for immunization: Secondary | ICD-10-CM

## 2020-03-02 DIAGNOSIS — E890 Postprocedural hypothyroidism: Secondary | ICD-10-CM

## 2020-03-02 DIAGNOSIS — I10 Essential (primary) hypertension: Secondary | ICD-10-CM | POA: Diagnosis not present

## 2020-03-02 DIAGNOSIS — Z3009 Encounter for other general counseling and advice on contraception: Secondary | ICD-10-CM

## 2020-03-02 DIAGNOSIS — E782 Mixed hyperlipidemia: Secondary | ICD-10-CM

## 2020-03-02 NOTE — Progress Notes (Signed)
Careteam: Patient Care Team: Sharon Seller, NP as PCP - General (Geriatric Medicine) Oneal Grout, MD (Inactive) as Consulting Physician (Internal Medicine)  PLACE OF SERVICE:  [facility]  Advanced Directive information    No Known Allergies  Chief Complaint  Patient presents with  . Medical Management of Chronic Issues    6 month follow up.  Marland Kitchen Best Practice Recommendations     vaccine, Flu vaccine     HPI: Patient is a 34 y.o. female for follow up.   Reports she got a new job, had to rearrange appts.   Migraines- every now and then. Uses propranolol daily  hypothyroid TSH at goal. Saw endocrinologist this week.   Anxiety- improved, not taking medication at this time.   Not preventing pregnancy- LMP 02/22/20    Review of Systems:  Review of Systems  Constitutional: Negative for chills, fever and weight loss.  HENT: Negative for tinnitus.   Respiratory: Negative for cough, sputum production and shortness of breath.   Cardiovascular: Negative for chest pain, palpitations and leg swelling.  Gastrointestinal: Negative for abdominal pain, constipation, diarrhea and heartburn.  Genitourinary: Negative for dysuria, frequency and urgency.  Musculoskeletal: Negative for back pain, falls, joint pain and myalgias.  Skin: Negative.   Neurological: Positive for headaches. Negative for dizziness.  Psychiatric/Behavioral: Negative for depression and memory loss. The patient does not have insomnia.     Past Medical History:  Diagnosis Date  . Allergic rhinitis   . Lower back pain   . Thyroid disease    Past Surgical History:  Procedure Laterality Date  . HERNIA REPAIR  1989    Dr.Kathleen Samuel Bouche    Social History:   reports that she has been smoking cigars. She has never used smokeless tobacco. She reports current alcohol use. She reports current drug use. Drug: Marijuana.  Family History  Problem Relation Age of Onset  . Breast cancer Maternal Aunt   .  Hypertension Mother     Medications: Patient's Medications  New Prescriptions   No medications on file  Previous Medications   AMLODIPINE (NORVASC) 10 MG TABLET    Take 1/2 (one-half) tablet by mouth once daily   LEVOTHYROXINE (SYNTHROID) 75 MCG TABLET    Take 1 tablet (75 mcg total) by mouth daily.   PROPRANOLOL (INDERAL) 40 MG TABLET    Take 1 tablet by mouth twice daily  Modified Medications   No medications on file  Discontinued Medications   CITALOPRAM (CELEXA) 10 MG TABLET    Take 1 tablet (10 mg total) by mouth daily.   DIPHENHYDRAMINE (BENADRYL) 25 MG TABLET    Take 25 mg by mouth as needed (Sinuses).   IBUPROFEN (ADVIL) 200 MG TABLET    Take 200 mg by mouth as needed (cramping).   SUMATRIPTAN (IMITREX) 50 MG TABLET    Take 1 tablet (50 mg total) by mouth daily as needed for migraine. May repeat in 2 hours if headache persists or recurs.    Physical Exam:  Vitals:   03/02/20 1554  BP: 130/90  Pulse: 69  Temp: (!) 97.2 F (36.2 C)  TempSrc: Temporal  SpO2: 98%  Weight: 192 lb (87.1 kg)  Height: 5\' 6"  (1.676 m)   Body mass index is 30.99 kg/m. Wt Readings from Last 3 Encounters:  03/02/20 192 lb (87.1 kg)  02/29/20 192 lb (87.1 kg)  08/22/19 191 lb 9.6 oz (86.9 kg)    Physical Exam Constitutional:      General: She  is not in acute distress.    Appearance: She is well-developed. She is not diaphoretic.  HENT:     Head: Normocephalic and atraumatic.     Mouth/Throat:     Pharynx: No oropharyngeal exudate.  Eyes:     Conjunctiva/sclera: Conjunctivae normal.     Pupils: Pupils are equal, round, and reactive to light.  Cardiovascular:     Rate and Rhythm: Normal rate and regular rhythm.     Heart sounds: Normal heart sounds.  Pulmonary:     Effort: Pulmonary effort is normal.     Breath sounds: Normal breath sounds.  Abdominal:     General: Bowel sounds are normal.     Palpations: Abdomen is soft.  Musculoskeletal:        General: No tenderness.      Cervical back: Normal range of motion and neck supple.  Skin:    General: Skin is warm and dry.  Neurological:     Mental Status: She is alert and oriented to person, place, and time.     Labs reviewed: Basic Metabolic Panel: Recent Labs    04/13/19 1121 08/23/19 0845 02/22/20 0837 02/29/20 1605  NA  --  139 138  --   K  --  4.0 3.9  --   CL  --  109 106  --   CO2  --  23 25  --   GLUCOSE  --  97 93  --   BUN  --  11 9  --   CREATININE  --  0.83 0.77  --   CALCIUM  --  8.7 9.1  --   TSH 2.39  --   --  3.75   Liver Function Tests: Recent Labs    08/23/19 0845 02/22/20 0837  AST 17 15  ALT 13 12  BILITOT 0.3 0.5  PROT 6.9 7.2   No results for input(s): LIPASE, AMYLASE in the last 8760 hours. No results for input(s): AMMONIA in the last 8760 hours. CBC: Recent Labs    08/23/19 0845 02/22/20 0837  WBC 4.9 4.9  NEUTROABS 2,381 2,259  HGB 11.8 12.0  HCT 34.3* 35.1  MCV 93.0 92.9  PLT 270 288   Lipid Panel: Recent Labs    08/23/19 0845 02/22/20 0837  CHOL 176 175  HDL 53 58  LDLCALC 108* 102*  TRIG 60 68  CHOLHDL 3.3 3.0   TSH: Recent Labs    04/13/19 1121 02/29/20 1605  TSH 2.39 3.75   A1C: No results found for: HGBA1C   Assessment/Plan 1. 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 Mixed hyperlipidemia -dietary modifications encouraged.   3. Essential hypertension -stable on norvasc and propranolol. Continue dietary modifications.   4. Postablative hypothyroidism Stable, continues on levothyroxine.   5. Need for influenza vaccination - Flu Vaccine QUAD 6+ mos PF IM (Fluarix Quad PF)  6. Family planning counseling Not trying to get pregnant but not preventing. Encouraged MVI with folic acid and encouraged follow up with OB/GYN to ensure safety with mediation and pregnancy   Next appt: yearly  Eryca Bolte K. Biagio Borg  [facility] [phone]

## 2020-03-02 NOTE — Patient Instructions (Signed)
To check with your OB/GYN in regards to taking propranolol since you are not preventing pregnancy.

## 2020-03-06 ENCOUNTER — Other Ambulatory Visit: Payer: Self-pay | Admitting: Nurse Practitioner

## 2020-03-06 DIAGNOSIS — I10 Essential (primary) hypertension: Secondary | ICD-10-CM

## 2020-03-28 ENCOUNTER — Other Ambulatory Visit: Payer: Self-pay | Admitting: Nurse Practitioner

## 2020-03-28 DIAGNOSIS — I10 Essential (primary) hypertension: Secondary | ICD-10-CM

## 2020-03-28 DIAGNOSIS — G43909 Migraine, unspecified, not intractable, without status migrainosus: Secondary | ICD-10-CM

## 2020-03-28 DIAGNOSIS — E0590 Thyrotoxicosis, unspecified without thyrotoxic crisis or storm: Secondary | ICD-10-CM

## 2020-06-13 ENCOUNTER — Other Ambulatory Visit: Payer: Self-pay | Admitting: Nurse Practitioner

## 2020-06-13 DIAGNOSIS — I10 Essential (primary) hypertension: Secondary | ICD-10-CM

## 2021-03-01 ENCOUNTER — Encounter: Payer: Self-pay | Admitting: Internal Medicine

## 2021-03-01 ENCOUNTER — Other Ambulatory Visit: Payer: Self-pay

## 2021-03-01 ENCOUNTER — Ambulatory Visit (INDEPENDENT_AMBULATORY_CARE_PROVIDER_SITE_OTHER): Payer: BC Managed Care – PPO | Admitting: Internal Medicine

## 2021-03-01 VITALS — BP 144/82 | HR 92 | Ht 66.0 in | Wt 183.0 lb

## 2021-03-01 DIAGNOSIS — E039 Hypothyroidism, unspecified: Secondary | ICD-10-CM | POA: Diagnosis not present

## 2021-03-01 DIAGNOSIS — E221 Hyperprolactinemia: Secondary | ICD-10-CM | POA: Diagnosis not present

## 2021-03-01 DIAGNOSIS — N911 Secondary amenorrhea: Secondary | ICD-10-CM | POA: Diagnosis not present

## 2021-03-01 NOTE — Progress Notes (Signed)
Name: Sayana Salley  MRN/ DOB: [id number], 12/21/1985    Age/ Sex: 35 y.o., female     PCP: Sharon Seller, NP   Reason for Endocrinology Evaluation: Hypothyroidism     Initial Endocrinology Clinic Visit: 06/04/2018    PATIENT IDENTIFIER: [name] is a 35 y.o., female with a past medical history of Graves' disease. She has followed with [facility] since 06/04/2018 for consultative assistance with management of her hypothyroidism.   HISTORICAL SUMMARY: The patient was first diagnosed with hyperthyroidism secondary to Graves' disease in 2015.  She was started on Methimazole that she took for 2 years but stopped due to lack of insurance. She presented again to her PCP in 03/2018 for evaluation of headaches, during work up she was foun to have elevated TSH at 6.29 uIU/mL.  She was started on levothyroxine 25 MCG daily by her PCP.   Paternal aunt with thyroid disease.  SUBJECTIVE:     Today (03/01/2021):  Ms. Troxler is here for a follow up on hypothyroidism.  She continues to take levothyroxine on daily basis.   She has been noted with weight loss , she has cut the sodas and has been exercising  Denies local neck swelling  Has occasional constipation  Denies palpitations  Energy level fluctuates but has fatigue  Has occasional nausea but no vomiting  No prior pregnancies     LMP delayed 5 weeks  She is sexually active and planning on conceiving      HISTORY:  Past Medical History:  Past Medical History:  Diagnosis Date   Allergic rhinitis    Lower back pain    Thyroid disease    Past Surgical History:  Past Surgical History:  Procedure Laterality Date   HERNIA REPAIR  1989    Dr.Kathleen Samuel Bouche    Social History:  reports that she quit smoking about 16 months ago. Her smoking use included cigarettes. She has never used smokeless tobacco. She reports current alcohol use. She reports current drug use. Drug: Marijuana. Family History:   Family History  Problem Relation Age of Onset   Breast cancer Maternal Aunt    Hypertension Mother      HOME MEDICATIONS: Allergies as of 03/01/2021   No Known Allergies      Medication List        Accurate as of March 01, 2021  4:13 PM. If you have any questions, ask your nurse or doctor.          amLODipine 10 MG tablet Commonly known as: NORVASC Take 1/2 (one-half) tablet by mouth once daily   levothyroxine 75 MCG tablet Commonly known as: SYNTHROID Take 1 tablet (75 mcg total) by mouth daily.   loratadine 10 MG tablet Commonly known as: CLARITIN Take 1 tablet by mouth as needed.   metFORMIN 500 MG 24 hr tablet Commonly known as: GLUCOPHAGE-XR Take 500 mg by mouth daily.   propranolol 40 MG tablet Commonly known as: INDERAL Take 1 tablet by mouth twice daily   Vitamin D3 1.25 MG (50000 UT) Caps Take 1 capsule by mouth once a week.       PHYSICAL EXAM: VS: BP (!) 144/82 (BP Location: Left Arm, Patient Position: Sitting, Cuff Size: Small)   Pulse 92   Ht 5\' 6"  (1.676 m)   Wt 183 lb (83 kg)   SpO2 99%   BMI 29.54 kg/m    EXAM: General: Pt appears well and is in NAD  Eyes: External eye  exam normal without stare, lid lag or exophthalmos.  EOM intact.    Neck: General: Supple without adenopathy. Thyroid: Thyroid gland normal.  Lungs: Clear with good BS bilat with no rales, rhonchi, or wheezes  Heart: Auscultation: RRR with normal S1 and S2, no gallops or murmurs  Abdomen: Normoactive bowel sounds, soft, nontender  Extremities: BL LE: no pretibial edema   Mental Status: Judgment, insight: intact Orientation: oriented to time, place, and person Mood and affect: no depression, anxiety, or agitation     DATA REVIEWED:  Latest Reference Range & Units 03/01/21 16:07  LH mIU/mL 22.1  FSH mIU/mL 13.0  Prolactin 4.8 - 23.3 ng/mL 27.0 (H)  Estradiol pg/mL 159.0  hCG,Beta Subunit,Qual,Serum Negative <6 mIU/mL Negative  TSH 0.450 - 4.500 uIU/mL  7.610 (H)    Thyroid Ultrasound 03/01/2019  Heterogeneous thyroid indicating medical thyroid disease.  ASSESSMENT / PLAN / RECOMMENDATIONS:   Hypothyroidism:   Clinically euthyroid  No local neck symptoms  Preconception counseling performed, patient to increase LT-4 replacement by 20% with positive pregnancy test.  We discussed increased LT-4 requirements during pregnancy  Medications   Stop levothyroxine 75 mcg daily  Start Levothyroxine 100 mcg daily   2.  Secondary amenorrhea:   -We will check FSH, LH, beta hCG, estradiol  3. Hyperprolactinemia :  - This could be secondary to  hypothyroidism  vs primary. Will adjust LT-4 replacement and recheck levels in 2 months     F/u in 1 yr   Signed electronically by: Lyndle Herrlich, MD  [facility] End[facility]  [facility] [address] [place] [place], [place] [address] Phone: [phone] FAX: [phone]   CC: Sharon Seller, NP [address] [place] [address] Phone: [phone] Fax: [phone]   Return to Endocrinology clinic as below: Future Appointments  Date Time Provider Department Center  03/04/2021  3:45 PM Sharon Seller, NP [facility] None  03/07/2022  3:00 PM Naraya Stoneberg, Konrad Dolores, MD [id number] None

## 2021-03-02 LAB — HCG, SERUM, QUALITATIVE: hCG,Beta Subunit,Qual,Serum: NEGATIVE m[IU]/mL (ref <6)

## 2021-03-02 LAB — TSH: TSH: 7.61 u[IU]/mL — ABNORMAL HIGH (ref 0.450–4.500)

## 2021-03-02 LAB — PROLACTIN: Prolactin: 27 ng/mL — ABNORMAL HIGH (ref 4.8–23.3)

## 2021-03-02 LAB — LUTEINIZING HORMONE: LH: 22.1 m[IU]/mL

## 2021-03-02 LAB — FOLLICLE STIMULATING HORMONE: FSH: 13 m[IU]/mL

## 2021-03-02 LAB — ESTRADIOL: Estradiol: 159 pg/mL

## 2021-03-04 ENCOUNTER — Other Ambulatory Visit: Payer: Self-pay

## 2021-03-04 ENCOUNTER — Encounter: Payer: Self-pay | Admitting: Nurse Practitioner

## 2021-03-04 ENCOUNTER — Ambulatory Visit (INDEPENDENT_AMBULATORY_CARE_PROVIDER_SITE_OTHER): Payer: BC Managed Care – PPO | Admitting: Nurse Practitioner

## 2021-03-04 VITALS — BP 156/98 | HR 95 | Temp 97.8°F | Ht 66.0 in | Wt 183.0 lb

## 2021-03-04 DIAGNOSIS — Z23 Encounter for immunization: Secondary | ICD-10-CM | POA: Diagnosis not present

## 2021-03-04 DIAGNOSIS — I10 Essential (primary) hypertension: Secondary | ICD-10-CM

## 2021-03-04 DIAGNOSIS — E039 Hypothyroidism, unspecified: Secondary | ICD-10-CM | POA: Diagnosis not present

## 2021-03-04 DIAGNOSIS — E0590 Thyrotoxicosis, unspecified without thyrotoxic crisis or storm: Secondary | ICD-10-CM

## 2021-03-04 DIAGNOSIS — G43909 Migraine, unspecified, not intractable, without status migrainosus: Secondary | ICD-10-CM

## 2021-03-04 MED ORDER — PROPRANOLOL HCL 60 MG PO TABS: 60.0000 mg | ORAL_TABLET | Freq: Two times a day (BID) | ORAL | 1 refills | Status: DC

## 2021-03-04 MED ORDER — LEVOTHYROXINE SODIUM 100 MCG PO TABS: 100.0000 ug | ORAL_TABLET | Freq: Every day | ORAL | 3 refills | Status: DC

## 2021-03-04 NOTE — Patient Instructions (Signed)
INCREASE PROPRANOLOL TO 60 mg twice daily  STOP norvasc  Monitor BP and HR at home after medication

## 2021-03-04 NOTE — Progress Notes (Signed)
Provider: Sharon Seller, NP  Patient Care Team: Sharon Seller, NP as PCP - General (Geriatric Medicine) Oneal Grout, MD (Inactive) as Consulting Physician (Internal Medicine)  Extended Emergency Contact Information Primary Emergency Contact: Frisch,[name] Address: [address], [place] [address] [place] Home Phone: [phone] Relation: Mother No Known Allergies Code Status: full  Goals of Care: Advanced Directive information Advanced Directives 03/04/2021  Does Patient Have a Medical Advance Directive? No  Would patient like information on creating a medical advance directive? No - Patient declined     Chief Complaint  Patient presents with   Annual Exam    Yearly physical. Discuss need for hep c screening and covid #4. Flu vaccine today. Patient has a GYN.     HPI: Patient is a 35 y.o. female seen in today for an wellness exam at [facility].  See GYN for PAP and breast exam and endocrinologist for thryoid, TSH high so synthroid was increased   Migraines are controlled.   She is now trying to get pregnant working with GYN and endocrinology.   GYN has placed on her metformin to get cycles more regular as she is trying to get pregnant.   No acute concerns today.    Depression screen [facility] 2/9 03/04/2021 02/14/2019 04/22/2018 07/06/2015 04/03/2015  Decreased Interest 0 0 0 0 0  Down, Depressed, Hopeless 0 0 0 0 0  PHQ - 2 Score 0 0 0 0 0    Fall Risk 04/22/2018 06/09/2018 02/14/2019 08/22/2019 03/04/2021  Falls in the past year? 0 - 0 0 0  Was there an injury with Fall? 0 - 0 0 0  Fall Risk Category Calculator 0 - 0 0 0  Fall Risk Category Low - Low Low Low  Patient Fall Risk Level Low fall risk Low fall risk Low fall risk Low fall risk Low fall risk  Patient at Risk for Falls Due to - - - - No Fall Risks  Fall risk Follow up - - - - Falls evaluation completed   No flowsheet data found.   Health Maintenance  Topic Date Due    Hepatitis C Screening  Never done   COVID-19 Vaccine (4 - Booster for Pfizer series) 05/05/2020   INFLUENZA VACCINE  10/29/2020   PAP SMEAR-Modifier  05/30/2022   TETANUS/TDAP  01/13/2023   HIV Screening  Completed   Pneumococcal Vaccine 45-62 Years old  Aged Out   HPV VACCINES  Aged Out    Past Medical History:  Diagnosis Date   Allergic rhinitis    Lower back pain    Thyroid disease     Past Surgical History:  Procedure Laterality Date   HERNIA REPAIR  1989    Dr.Kathleen Samuel Bouche     Social History   Socioeconomic History   Marital status: Single    Spouse name: Not on file   Number of children: Not on file   Years of education: Not on file   Highest education level: Not on file  Occupational History   Not on file  Tobacco Use   Smoking status: Former    Years: 2.00    Types: Cigarettes    Quit date: 10/30/2019    Years since quitting: 1.3   Smokeless tobacco: Never  Vaping Use   Vaping Use: Never used  Substance and Sexual Activity   Alcohol use: Yes    Alcohol/week: 0.0 standard drinks    Comment: once  a week   Drug use: Yes    Types: Marijuana    Comment: For migraines   Sexual activity: Yes    Birth control/protection: None  Other Topics Concern   Not on file  Social History Narrative   GYN- Dr.Beth Maurice March    ENT- Dr. Flo Shanks   Social Determinants of Health   Financial Resource Strain: Not on file  Food Insecurity: Not on file  Transportation Needs: Not on file  Physical Activity: Not on file  Stress: Not on file  Social Connections: Not on file    Family History  Problem Relation Age of Onset   Breast cancer Maternal Aunt    Hypertension Mother     Review of Systems:  Review of Systems  Constitutional:  Negative for chills, fever and weight loss.  HENT:  Negative for tinnitus.   Respiratory:  Negative for cough, sputum production and shortness of breath.   Cardiovascular:  Positive for palpitations. Negative for chest pain and leg  swelling.  Gastrointestinal:  Negative for abdominal pain, constipation, diarrhea and heartburn.  Genitourinary:  Negative for dysuria, frequency and urgency.  Musculoskeletal:  Negative for back pain, falls, joint pain and myalgias.  Skin: Negative.   Neurological:  Negative for dizziness and headaches.  Psychiatric/Behavioral:  Negative for depression and memory loss. The patient does not have insomnia.     Allergies as of 03/04/2021   No Known Allergies      Medication List        Accurate as of March 04, 2021  3:49 PM. If you have any questions, ask your nurse or doctor.          amLODipine 10 MG tablet Commonly known as: NORVASC Take 1/2 (one-half) tablet by mouth once daily   levothyroxine 100 MCG tablet Commonly known as: SYNTHROID Take 1 tablet (100 mcg total) by mouth daily.   loratadine 10 MG tablet Commonly known as: CLARITIN Take 1 tablet by mouth as needed.   metFORMIN 500 MG 24 hr tablet Commonly known as: GLUCOPHAGE-XR Take 500 mg by mouth daily.   propranolol 40 MG tablet Commonly known as: INDERAL Take 1 tablet by mouth twice daily   Vitamin D3 1.25 MG (50000 UT) Caps Take 1 capsule by mouth once a week.          Physical Exam: Vitals:   03/04/21 1532  BP: (!) 156/98  Pulse: 95  Temp: 97.8 F (36.6 C)  TempSrc: Temporal  SpO2: 99%  Weight: 183 lb (83 kg)  Height: 5\' 6"  (1.676 m)   Body mass index is 29.54 kg/m. Wt Readings from Last 3 Encounters:  03/04/21 183 lb (83 kg)  03/01/21 183 lb (83 kg)  03/02/20 192 lb (87.1 kg)    Physical Exam Constitutional:      General: She is not in acute distress.    Appearance: She is well-developed. She is not diaphoretic.  HENT:     Head: Normocephalic and atraumatic.     Mouth/Throat:     Pharynx: No oropharyngeal exudate.  Eyes:     Conjunctiva/sclera: Conjunctivae normal.     Pupils: Pupils are equal, round, and reactive to light.  Cardiovascular:     Rate and Rhythm: Normal  rate and regular rhythm.     Heart sounds: Normal heart sounds.  Pulmonary:     Effort: Pulmonary effort is normal.     Breath sounds: Normal breath sounds.  Abdominal:     General: Bowel sounds are normal.  Palpations: Abdomen is soft.  Musculoskeletal:     Cervical back: Normal range of motion and neck supple.     Right lower leg: No edema.     Left lower leg: No edema.  Skin:    General: Skin is warm and dry.  Neurological:     Mental Status: She is alert.  Psychiatric:        Mood and Affect: Mood normal.    Labs reviewed: Basic Metabolic Panel: Recent Labs    03/01/21 1607  TSH 7.610*   Liver Function Tests: No results for input(s): AST, ALT, ALKPHOS, BILITOT, PROT, ALBUMIN in the last 8760 hours. No results for input(s): LIPASE, AMYLASE in the last 8760 hours. No results for input(s): AMMONIA in the last 8760 hours. CBC: No results for input(s): WBC, NEUTROABS, HGB, HCT, MCV, PLT in the last 8760 hours. Lipid Panel: No results for input(s): CHOL, HDL, LDLCALC, TRIG, CHOLHDL, LDLDIRECT in the last 8760 hours. No results found for: HGBA1C  Procedures: No results found.  Assessment/Plan 1. Need for influenza vaccination - Flu Vaccine QUAD 6+ mos PF IM (Fluarix Quad PF)  2. Hypothyroidism -TSH elevated on last lab, followed by endocrinology who recently increase synthroid.   3. Essential hypertension -elevated today, will have her stop norvasc as she is trying to get pregnant and change beta blocker to labetalol  -labetalol (NORMODYNE) 100 MG tablet; Take 1 tablet (100 mg total) by mouth 2 (two) times daily.  Dispense: 60 tablet; Refill: 1  4. Migraine without status migrainosus, not intractable, unspecified migraine type -stable at this time, will have her stop propranolol and change to labetalol for pregnancy safety.  Will have her monitor migraines and treat as needed.    Next appt: 6 weeks for bp and headaches.  Mariah Rocha. Biagio Borg  [facility]  Adult Medicine [phone]

## 2021-03-05 ENCOUNTER — Other Ambulatory Visit: Payer: Self-pay | Admitting: Internal Medicine

## 2021-03-05 DIAGNOSIS — E039 Hypothyroidism, unspecified: Secondary | ICD-10-CM

## 2021-03-05 MED ORDER — LABETALOL HCL 100 MG PO TABS: 100.0000 mg | ORAL_TABLET | Freq: Two times a day (BID) | ORAL | 1 refills | Status: DC

## 2021-03-28 ENCOUNTER — Other Ambulatory Visit: Payer: Self-pay

## 2021-03-28 ENCOUNTER — Telehealth (INDEPENDENT_AMBULATORY_CARE_PROVIDER_SITE_OTHER): Payer: BC Managed Care – PPO | Admitting: Nurse Practitioner

## 2021-03-28 DIAGNOSIS — J069 Acute upper respiratory infection, unspecified: Secondary | ICD-10-CM

## 2021-03-28 NOTE — Progress Notes (Signed)
This service is provided via telemedicine  No vital signs collected/recorded due to the encounter was a telemedicine visit.   Location of patient (ex: home, work):  Home  Patient consents to a telephone visit:  Yes, see encounter dated 03/28/2021  Location of the provider (ex: office, home):  Home  Name of any referring provider:  N/A  Names of all persons participating in the telemedicine service and their role in the encounter:  Abbey Chatters, Nurse Practitioner, Elveria Royals, CMA, and patient.   Time spent on call:  9 minutes with medical assistant

## 2021-03-28 NOTE — Patient Instructions (Signed)
Netipot or saline wash daily ?Plain nasal saline spray throughout the day as needed ?May use tylenol 500 mg 2 tablets every 8 hours as needed aches and pains or sore throat ?humidifier in the home to help with the dry air ?Mucinex DM by mouth twice daily as needed for cough and chest congestion with full glass of water  ?Keep well hydrated ?Proper nutrition  ?Avoid forcefully blowing nose ?Vit c 1000 mg daily ?Vit d 2000 units daily  ?Zinc 50 mg daily  ? ?

## 2021-03-28 NOTE — Progress Notes (Signed)
Careteam: Patient Care Team: Sharon Seller, NP as PCP - General (Geriatric Medicine)  Advanced Directive information    No Known Allergies  Chief Complaint  Patient presents with   Acute Visit    Patient thinks she may have a cold. Stuffy nose, headache and sore throat. Since before Christmas. No fever. Patient somplains of having body aches yesterday. Patient had cough,but took robitussin and seemed to get better. No chest congestion. Patient has been taking alka seltzer cold     HPI: Patient is a 35 y.o. female due to not feeling well.  Reports she has head congestion, sore throat, body aches. No fever.  Runny nose.  Had cough but this has gone away.  Started 3-4 days ago.  Taking robitussin and alkalizer cold at night.  Mostly sinuses bothering her.  Increase fatigue.   Review of Systems:  Review of Systems  Constitutional:  Positive for malaise/fatigue. Negative for chills and fever.  HENT:  Positive for congestion.   Respiratory:  Positive for cough and sputum production.   Cardiovascular:  Negative for chest pain and palpitations.  Neurological:  Positive for headaches. Negative for dizziness.   Past Medical History:  Diagnosis Date   Allergic rhinitis    Lower back pain    Thyroid disease    Past Surgical History:  Procedure Laterality Date   HERNIA REPAIR  1989    Dr.Kathleen Samuel Bouche    Social History:   reports that she quit smoking about 16 months ago. Her smoking use included cigarettes. She has never used smokeless tobacco. She reports current alcohol use. She reports current drug use. Drug: Marijuana.  Family History  Problem Relation Age of Onset   Breast cancer Maternal Aunt    Hypertension Mother     Medications: Patient's Medications  New Prescriptions   No medications on file  Previous Medications   CHOLECALCIFEROL (VITAMIN D3) 1.25 MG (50000 UT) CAPS    Take 1 capsule by mouth once a week.   LABETALOL (NORMODYNE) 100 MG TABLET     Take 1 tablet (100 mg total) by mouth 2 (two) times daily.   LEVOTHYROXINE (SYNTHROID) 100 MCG TABLET    Take 1 tablet (100 mcg total) by mouth daily.   LORATADINE (CLARITIN) 10 MG TABLET    Take 1 tablet by mouth as needed.   METFORMIN (GLUCOPHAGE-XR) 500 MG 24 HR TABLET    Take 500 mg by mouth daily.  Modified Medications   No medications on file  Discontinued Medications   No medications on file    Physical Exam:  There were no vitals filed for this visit. There is no height or weight on file to calculate BMI. Wt Readings from Last 3 Encounters:  03/04/21 183 lb (83 kg)  03/01/21 183 lb (83 kg)  03/02/20 192 lb (87.1 kg)    Physical Exam Constitutional:      Appearance: Normal appearance.  Pulmonary:     Effort: Pulmonary effort is normal.  Neurological:     Mental Status: She is alert.    Labs reviewed: Basic Metabolic Panel: Recent Labs    03/01/21 1607  TSH 7.610*   Liver Function Tests: No results for input(s): AST, ALT, ALKPHOS, BILITOT, PROT, ALBUMIN in the last 8760 hours. No results for input(s): LIPASE, AMYLASE in the last 8760 hours. No results for input(s): AMMONIA in the last 8760 hours. CBC: No results for input(s): WBC, NEUTROABS, HGB, HCT, MCV, PLT in the last 8760 hours. Lipid Panel:  No results for input(s): CHOL, HDL, LDLCALC, TRIG, CHOLHDL, LDLDIRECT in the last 8760 hours. TSH: Recent Labs    03/01/21 1607  TSH 7.610*   A1C: No results found for: HGBA1C   Assessment/Plan 1. Viral URI -she will take COVID home test.  Netipot or saline wash daily Plain nasal saline spray throughout the day as needed May use tylenol 500 mg 2 tablets every 8 hours as needed aches and pains or sore throat humidifier in the home to help with the dry air Mucinex DM by mouth twice daily as needed for cough and chest congestion with full glass of water  Keep well hydrated Proper nutrition  Avoid forcefully blowing nose Vit c 1000 mg daily Vit d 2000 units  daily  Zinc 50 mg daily   Next appt: 05/03/2021  Shanda Bumps K. Biagio Borg  [facility] [phone]    Virtual Visit via Earleen Reaper  I connected with patient on 03/28/21 at 10:00 AM EST by video and verified that I am speaking with the correct person using two identifiers.  Location: Patient: home Provider: home-remote   I discussed the limitations, risks, security and privacy concerns of performing an evaluation and management service by telephone and the availability of in person appointments. I also discussed with the patient that there may be a patient responsible charge related to this service. The patient expressed understanding and agreed to proceed.   I discussed the assessment and treatment plan with the patient. The patient was provided an opportunity to ask questions and all were answered. The patient agreed with the plan and demonstrated an understanding of the instructions.   The patient was advised to call back or seek an in-person evaluation if the symptoms worsen or if the condition fails to improve as anticipated.  I provided 12 minutes of non-face-to-face time during this encounter.  Janene Harvey. Biagio Borg Avs printed and mailed

## 2021-04-02 ENCOUNTER — Encounter: Payer: Self-pay | Admitting: Family

## 2021-04-02 ENCOUNTER — Ambulatory Visit (INDEPENDENT_AMBULATORY_CARE_PROVIDER_SITE_OTHER): Payer: BC Managed Care – PPO | Admitting: Family

## 2021-04-02 ENCOUNTER — Other Ambulatory Visit: Payer: Self-pay

## 2021-04-02 VITALS — BP 140/86 | HR 87 | Temp 97.6°F | Resp 16 | Ht 66.0 in

## 2021-04-02 DIAGNOSIS — J029 Acute pharyngitis, unspecified: Secondary | ICD-10-CM | POA: Diagnosis not present

## 2021-04-02 DIAGNOSIS — J0100 Acute maxillary sinusitis, unspecified: Secondary | ICD-10-CM | POA: Diagnosis not present

## 2021-04-02 DIAGNOSIS — R519 Headache, unspecified: Secondary | ICD-10-CM | POA: Diagnosis not present

## 2021-04-02 DIAGNOSIS — R051 Acute cough: Secondary | ICD-10-CM

## 2021-04-02 DIAGNOSIS — R110 Nausea: Secondary | ICD-10-CM

## 2021-04-02 DIAGNOSIS — R52 Pain, unspecified: Secondary | ICD-10-CM

## 2021-04-02 LAB — POCT INFLUENZA A/B
Influenza A, POC: NEGATIVE
Influenza B, POC: NEGATIVE

## 2021-04-02 LAB — POCT RAPID STREP A (OFFICE): Rapid Strep A Screen: NEGATIVE

## 2021-04-02 MED ORDER — AZITHROMYCIN 250 MG PO TABS: ORAL_TABLET | ORAL | 0 refills | Status: AC

## 2021-04-02 NOTE — Progress Notes (Signed)
Provider: Drezden Seitzinger FNP-C  Lauree Chandler, NP  Patient Care Team: Lauree Chandler, NP as PCP - General (Geriatric Medicine)  Extended Emergency Contact Information Primary Emergency Contact: Fitzgerald,[name] Address: [place], [place] [address] [place] Phone: [phone] Relation: Mother  Code Status:  Full Code  Goals of care: Advanced Directive information Advanced Directives 04/02/2021  Does Patient Have a Medical Advance Directive? No  Would patient like information on creating a medical advance directive? No - Patient declined     Chief Complaint  Patient presents with   Acute Visit    Patient complains of congestion, headache, runny nose, sore throat, body aches, and nausea.     HPI:  Pt is a 36 y.o. female seen today for an acute visit for evaluation of sore throat,runny nose,nasal congestion,body aches and nausea x 1 week. Has been taking Alkaseltzer,mucinex for cough.thought it was common cold but symptoms seems to be worsening. Aunt had bronchitis after christmas.  She had a video visit with PCP 03/28/2021 was advised to take OTC vit C,D,Zinc,mucinex DM and Tylenol.Also use nasal spray,Netipot and humidifier but had no relief.    Past Medical History:  Diagnosis Date   Allergic rhinitis    Lower back pain    Thyroid disease    Past Surgical History:  Procedure Laterality Date   HERNIA REPAIR  1989    Dr.Kathleen Lucas     No Known Allergies  Outpatient Encounter Medications as of 04/02/2021  Medication Sig   Cholecalciferol (VITAMIN D3) 1.25 MG (50000 UT) CAPS Take 1 capsule by mouth once a week.   labetalol (NORMODYNE) 100 MG tablet Take 1 tablet (100 mg total) by mouth 2 (two) times daily.   levothyroxine (SYNTHROID) 100 MCG tablet Take 1 tablet (100 mcg total) by mouth daily.   loratadine (CLARITIN) 10 MG tablet Take 1 tablet by mouth as needed.   metFORMIN (GLUCOPHAGE-XR) 500 MG 24 hr tablet Take 500  mg by mouth daily.   No facility-administered encounter medications on file as of 04/02/2021.    Review of Systems  Constitutional:  Negative for appetite change, chills, fatigue, fever and unexpected weight change.       Body aches   HENT:  Positive for congestion, rhinorrhea, sinus pressure, sinus pain and sore throat. Negative for dental problem, ear discharge, ear pain, facial swelling, hearing loss, postnasal drip, sneezing, tinnitus and trouble swallowing.   Eyes:  Negative for pain, discharge, redness, itching and visual disturbance.  Respiratory:  Positive for cough. Negative for chest tightness, shortness of breath and wheezing.   Cardiovascular:  Negative for chest pain, palpitations and leg swelling.  Gastrointestinal:  Negative for abdominal distention, abdominal pain, constipation, diarrhea, nausea and vomiting.  Skin:  Negative for color change, pallor and rash.  Neurological:  Positive for headaches. Negative for dizziness, syncope, speech difficulty, weakness, light-headedness and numbness.   Immunization History  Administered Date(s) Administered   Influenza,inj,Quad PF,6+ Mos 01/12/2013, 02/22/2014, 04/03/2015, 04/03/2016, 02/14/2019, 03/02/2020, 03/04/2021   Influenza-Unspecified 12/25/2011   PFIZER(Purple Top)SARS-COV-2 Vaccination 06/30/2019, 07/25/2019, 03/10/2020   PPD Test 12/29/2011, 09/16/2019   Tdap 01/12/2013   Pertinent  Health Maintenance Due  Topic Date Due   PAP SMEAR-Modifier  05/30/2022   INFLUENZA VACCINE  Completed   Fall Risk 06/09/2018 02/14/2019 08/22/2019 03/04/2021 04/02/2021  Falls in the past year? - 0 0 0 0  Was there an injury with Fall? - 0 0  0 0  Fall Risk Category Calculator - 0 0 0 0  Fall Risk Category - Low Low Low Low  Patient Fall Risk Level Low fall risk Low fall risk Low fall risk Low fall risk Low fall risk  Patient at Risk for Falls Due to - - - No Fall Risks No Fall Risks  Fall risk Follow up - - - Falls evaluation completed Falls  evaluation completed   Functional Status Survey:    Vitals:   04/02/21 1459  BP: 140/86  Pulse: 87  Resp: 16  Temp: 97.6 F (36.4 C)  SpO2: 99%  Height: 5\' 6"  (1.676 m)   Body mass index is 29.54 kg/m. Physical Exam Vitals reviewed.  Constitutional:      General: She is not in acute distress.    Appearance: Normal appearance. She is normal weight. She is not ill-appearing or diaphoretic.  HENT:     Head: Normocephalic.     Right Ear: Tympanic membrane, ear canal and external ear normal. There is no impacted cerumen.     Left Ear: Tympanic membrane, ear canal and external ear normal. There is no impacted cerumen.     Nose: Congestion and rhinorrhea present.     Right Turbinates: Not enlarged or swollen.     Left Turbinates: Not swollen.     Right Sinus: Maxillary sinus tenderness present.     Left Sinus: Maxillary sinus tenderness present.     Mouth/Throat:     Mouth: Mucous membranes are moist.     Pharynx: Oropharynx is clear. No oropharyngeal exudate or posterior oropharyngeal erythema.  Eyes:     General: No scleral icterus.       Right eye: No discharge.        Left eye: No discharge.     Extraocular Movements: Extraocular movements intact.     Conjunctiva/sclera: Conjunctivae normal.     Pupils: Pupils are equal, round, and reactive to light.  Cardiovascular:     Rate and Rhythm: Normal rate and regular rhythm.     Pulses: Normal pulses.     Heart sounds: Normal heart sounds. No murmur heard.   No friction rub. No gallop.  Pulmonary:     Effort: Pulmonary effort is normal. No respiratory distress.     Breath sounds: Normal breath sounds. No wheezing, rhonchi or rales.  Chest:     Chest wall: No tenderness.  Abdominal:     General: Bowel sounds are normal. There is no distension.     Palpations: Abdomen is soft. There is no mass.     Tenderness: There is no abdominal tenderness. There is no guarding.  Musculoskeletal:        General: No swelling or  tenderness. Normal range of motion.     Cervical back: Normal range of motion. No rigidity or tenderness.     Right lower leg: No edema.     Left lower leg: No edema.  Lymphadenopathy:     Cervical: No cervical adenopathy.  Skin:    General: Skin is warm and dry.     Coloration: Skin is not pale.     Findings: No erythema or rash.  Neurological:     Mental Status: She is alert and oriented to person, place, and time.     Motor: No weakness.     Gait: Gait normal.  Psychiatric:        Mood and Affect: Mood normal.        Speech: Speech normal.  Behavior: Behavior normal.    Labs reviewed: No results for input(s): NA, K, CL, CO2, GLUCOSE, BUN, CREATININE, CALCIUM, MG, PHOS in the last 8760 hours. No results for input(s): AST, ALT, ALKPHOS, BILITOT, PROT, ALBUMIN in the last 8760 hours. No results for input(s): WBC, NEUTROABS, HGB, HCT, MCV, PLT in the last 8760 hours. Lab Results  Component Value Date   TSH 7.610 (H) 03/01/2021   No results found for: HGBA1C Lab Results  Component Value Date   CHOL 175 02/22/2020   HDL 58 02/22/2020   LDLCALC 102 (H) 02/22/2020   TRIG 68 02/22/2020   CHOLHDL 3.0 02/22/2020    Significant Diagnostic Results in last 30 days:  No results found.  Assessment/Plan 1. Nasal congestion  nasal spray ineffective  Congested  Will rule out COVID-19  - SARS-COV-2 RNA,(COVID-19) QUAL NAAT - POC Influenza A/B results are negative  - POC Rapid Strep A result negative   2. Nonintractable headache, unspecified chronicity pattern, unspecified headache type Suspected due to ongoing URI  - continue on Tylenol as needed for pain  - SARS-COV-2 RNA,(COVID-19) QUAL NAAT - POC Influenza A/B - POC Rapid Strep A  3. Sore throat Tender to palpation no adenopathy  - SARS-COV-2 RNA,(COVID-19) QUAL NAAT - POC Influenza A/B - POC Rapid Strep A  4. Body aches - OTC Tylenol as needed  - SARS-COV-2 RNA,(COVID-19) QUAL NAAT - POC Influenza A/B - POC  Rapid Strep A  5. Nausea Intermittent  Will screen for COVID-19  Increase fluid intake/Tea /soup  - SARS-COV-2 RNA,(COVID-19) QUAL NAAT - POC Influenza A/B - POC Rapid Strep A   Family/ staff Communication: Reviewed plan of care with patient verbalized understanding   Labs/tests ordered: - SARS-COV-2 RNA,(COVID-19) QUAL NAAT - POC Influenza A/B - POC Rapid Strep A  Next Appointment: As needed if symptoms worsen or fail to improve    Mariah Hughs, NP

## 2021-04-03 LAB — SARS-COV-2 RNA,(COVID-19) QUALITATIVE NAAT: SARS CoV2 RNA: NOT DETECTED

## 2021-05-03 ENCOUNTER — Ambulatory Visit: Payer: BC Managed Care – PPO | Admitting: Nurse Practitioner

## 2021-05-16 ENCOUNTER — Other Ambulatory Visit (INDEPENDENT_AMBULATORY_CARE_PROVIDER_SITE_OTHER): Payer: BC Managed Care – PPO

## 2021-05-16 ENCOUNTER — Other Ambulatory Visit: Payer: Self-pay

## 2021-05-16 DIAGNOSIS — E039 Hypothyroidism, unspecified: Secondary | ICD-10-CM | POA: Diagnosis not present

## 2021-05-16 DIAGNOSIS — E221 Hyperprolactinemia: Secondary | ICD-10-CM

## 2021-05-16 DIAGNOSIS — N911 Secondary amenorrhea: Secondary | ICD-10-CM

## 2021-05-17 LAB — CBC
HCT: 34.3 % — ABNORMAL LOW (ref 36.0–46.0)
Hemoglobin: 11.3 g/dL — ABNORMAL LOW (ref 12.0–15.0)
MCHC: 32.9 g/dL (ref 30.0–36.0)
MCV: 94 fl (ref 78.0–100.0)
Platelets: 276 10*3/uL (ref 150.0–400.0)
RBC: 3.65 Mil/uL — ABNORMAL LOW (ref 3.87–5.11)
RDW: 13.6 % (ref 11.5–15.5)
WBC: 6.4 10*3/uL (ref 4.0–10.5)

## 2021-05-17 LAB — HCG, SERUM, QUALITATIVE: Preg, Serum: NEGATIVE

## 2021-05-17 LAB — COMPREHENSIVE METABOLIC PANEL
ALT: 21 U/L (ref 0–35)
AST: 20 U/L (ref 0–37)
Albumin: 4.5 g/dL (ref 3.5–5.2)
Alkaline Phosphatase: 37 U/L — ABNORMAL LOW (ref 39–117)
BUN: 14 mg/dL (ref 6–23)
CO2: 30 mEq/L (ref 19–32)
Calcium: 9.5 mg/dL (ref 8.4–10.5)
Chloride: 104 mEq/L (ref 96–112)
Creatinine, Ser: 0.82 mg/dL (ref 0.40–1.20)
GFR: 93.65 mL/min (ref >60.00)
Glucose, Bld: 93 mg/dL (ref 70–99)
Potassium: 4.3 mEq/L (ref 3.5–5.1)
Sodium: 138 mEq/L (ref 135–145)
Total Bilirubin: 0.4 mg/dL (ref 0.2–1.2)
Total Protein: 7.2 g/dL (ref 6.0–8.3)

## 2021-05-17 LAB — TSH: TSH: 1.23 u[IU]/mL (ref 0.35–5.50)

## 2021-05-17 LAB — PROLACTIN: Prolactin: 9.5 ng/mL

## 2021-05-24 ENCOUNTER — Other Ambulatory Visit: Payer: Self-pay

## 2021-05-24 ENCOUNTER — Encounter: Payer: Self-pay | Admitting: Nurse Practitioner

## 2021-05-24 ENCOUNTER — Ambulatory Visit (INDEPENDENT_AMBULATORY_CARE_PROVIDER_SITE_OTHER): Payer: BC Managed Care – PPO | Admitting: Nurse Practitioner

## 2021-05-24 VITALS — BP 138/90 | HR 89 | Temp 97.3°F | Ht 66.0 in | Wt 185.0 lb

## 2021-05-24 DIAGNOSIS — R519 Headache, unspecified: Secondary | ICD-10-CM | POA: Diagnosis not present

## 2021-05-24 DIAGNOSIS — E039 Hypothyroidism, unspecified: Secondary | ICD-10-CM | POA: Diagnosis not present

## 2021-05-24 DIAGNOSIS — J302 Other seasonal allergic rhinitis: Secondary | ICD-10-CM | POA: Diagnosis not present

## 2021-05-24 DIAGNOSIS — I10 Essential (primary) hypertension: Secondary | ICD-10-CM | POA: Diagnosis not present

## 2021-05-24 MED ORDER — LABETALOL HCL 100 MG PO TABS: 150.0000 mg | ORAL_TABLET | Freq: Two times a day (BID) | ORAL | 1 refills | Status: DC

## 2021-05-24 NOTE — Progress Notes (Signed)
Careteam: Patient Care Team: Sharon Seller, NP as PCP - General (Geriatric Medicine)  PLACE OF SERVICE:  [facility]  Advanced Directive information    No Known Allergies  Chief Complaint  Patient presents with   Follow-up    Follow-up on headaches and blood pressure. Patient with ongoing headaches daily.      HPI: Patient is a 36 y.o. female for routine follow up.  Blood pressure at home 132/78 No shortness breath or chest pain.  No palpitations.   Continues with headache daily- requesting referral for headache clinic Ibuprofen helps some.   Review of Systems:  Review of Systems  Constitutional:  Negative for chills, fever and weight loss.  HENT:  Negative for tinnitus.   Respiratory:  Negative for cough, sputum production and shortness of breath.   Cardiovascular:  Negative for chest pain, palpitations and leg swelling.  Gastrointestinal:  Negative for abdominal pain, constipation, diarrhea and heartburn.  Genitourinary:  Negative for dysuria, frequency and urgency.  Musculoskeletal:  Negative for back pain, falls, joint pain and myalgias.  Skin: Negative.   Neurological:  Negative for dizziness and headaches.  Endo/Heme/Allergies:  Positive for environmental allergies.  Psychiatric/Behavioral:  Negative for depression and memory loss. The patient does not have insomnia.    Past Medical History:  Diagnosis Date   Allergic rhinitis    Lower back pain    Thyroid disease    Past Surgical History:  Procedure Laterality Date   HERNIA REPAIR  1989    Dr.Kathleen Samuel Bouche    Social History:   reports that she quit smoking about 18 months ago. Her smoking use included cigarettes. She has never used smokeless tobacco. She reports current alcohol use. She reports current drug use. Drug: Marijuana.  Family History  Problem Relation Age of Onset   Breast cancer Maternal Aunt    Hypertension Mother     Medications: Patient's Medications  New Prescriptions    No medications on file  Previous Medications   CHOLECALCIFEROL (VITAMIN D3) 1.25 MG (50000 UT) CAPS    Take 1 capsule by mouth once a week.   LABETALOL (NORMODYNE) 100 MG TABLET    Take 1 tablet (100 mg total) by mouth 2 (two) times daily.   LEVOTHYROXINE (SYNTHROID) 100 MCG TABLET    Take 1 tablet (100 mcg total) by mouth daily.   LORATADINE (CLARITIN) 10 MG TABLET    Take 1 tablet by mouth as needed.   METFORMIN (GLUCOPHAGE-XR) 500 MG 24 HR TABLET    Take 500 mg by mouth daily.  Modified Medications   No medications on file  Discontinued Medications   No medications on file    Physical Exam:  Vitals:   05/24/21 1603  BP: 138/90  Pulse: 89  Temp: (!) 97.3 F (36.3 C)  TempSrc: Temporal  SpO2: 99%  Weight: 185 lb (83.9 kg)  Height: 5\' 6"  (1.676 m)   Body mass index is 29.86 kg/m. Wt Readings from Last 3 Encounters:  05/24/21 185 lb (83.9 kg)  03/04/21 183 lb (83 kg)  03/01/21 183 lb (83 kg)    Physical Exam Constitutional:      General: She is not in acute distress.    Appearance: She is well-developed. She is not diaphoretic.  HENT:     Head: Normocephalic and atraumatic.     Right Ear: Tympanic membrane normal.     Left Ear: Tympanic membrane normal.     Nose: Nose normal.     Mouth/Throat:  Pharynx: No oropharyngeal exudate.  Eyes:     Conjunctiva/sclera: Conjunctivae normal.     Pupils: Pupils are equal, round, and reactive to light.  Cardiovascular:     Rate and Rhythm: Normal rate and regular rhythm.     Heart sounds: Normal heart sounds.  Pulmonary:     Effort: Pulmonary effort is normal.     Breath sounds: Normal breath sounds.  Abdominal:     General: Bowel sounds are normal.     Palpations: Abdomen is soft.  Musculoskeletal:     Cervical back: Normal range of motion and neck supple.     Right lower leg: No edema.     Left lower leg: No edema.  Skin:    General: Skin is warm and dry.  Neurological:     Mental Status: She is alert and  oriented to person, place, and time. Mental status is at baseline.  Psychiatric:        Mood and Affect: Mood normal.    Labs reviewed: Basic Metabolic Panel: Recent Labs    03/01/21 1607 05/16/21 1550  NA  --  138  K  --  4.3  CL  --  104  CO2  --  30  GLUCOSE  --  93  BUN  --  14  CREATININE  --  0.82  CALCIUM  --  9.5  TSH 7.610* 1.23   Liver Function Tests: Recent Labs    05/16/21 1550  AST 20  ALT 21  ALKPHOS 37*  BILITOT 0.4  PROT 7.2  ALBUMIN 4.5   No results for input(s): LIPASE, AMYLASE in the last 8760 hours. No results for input(s): AMMONIA in the last 8760 hours. CBC: Recent Labs    05/16/21 1550  WBC 6.4  HGB 11.3*  HCT 34.3*  MCV 94.0  PLT 276.0   Lipid Panel: No results for input(s): CHOL, HDL, LDLCALC, TRIG, CHOLHDL, LDLDIRECT in the last 8760 hours. TSH: Recent Labs    03/01/21 1607 05/16/21 1550  TSH 7.610* 1.23   A1C: No results found for: HGBA1C   Assessment/Plan 1. Nonintractable headache, unspecified chronicity pattern, unspecified headache type Ongoing, will refer to headache clinic for further evaluation.  - AMB referral to headache clinic  2. Essential hypertension -will increase to 150 mg BID for ideal control. <140/90 -dash diet.   - labetalol (NORMODYNE) 100 MG tablet; Take 1.5 tablets (150 mg total) by mouth 2 (two) times daily.  Dispense: 270 tablet; Refill: 1  3. Acquired hypothyroidism TSH at goal, managed by endocrinology.   4. Seasonal allergic rhinitis, unspecified trigger -to use zyrtec 10 mg or xyzal OTC by mouth daily   Omaira Mellen K. Biagio Borg  [facility] [phone]

## 2021-05-24 NOTE — Patient Instructions (Signed)
Increase labetolol to 1.5 tablet 150 mg by mouth twice daily  To try zyrtec or xyzal daily for allergies.

## 2021-06-12 ENCOUNTER — Ambulatory Visit (INDEPENDENT_AMBULATORY_CARE_PROVIDER_SITE_OTHER): Payer: BC Managed Care – PPO | Admitting: Adult Health

## 2021-06-12 ENCOUNTER — Ambulatory Visit: Payer: BC Managed Care – PPO | Admitting: Family

## 2021-06-12 ENCOUNTER — Other Ambulatory Visit: Payer: Self-pay

## 2021-06-12 ENCOUNTER — Encounter: Payer: Self-pay | Admitting: Adult Health

## 2021-06-12 VITALS — BP 160/98 | HR 87 | Temp 97.1°F | Ht 66.0 in

## 2021-06-12 DIAGNOSIS — J029 Acute pharyngitis, unspecified: Secondary | ICD-10-CM

## 2021-06-12 LAB — POCT INFLUENZA A/B
Influenza A, POC: NEGATIVE
Influenza B, POC: NEGATIVE

## 2021-06-12 MED ORDER — AZITHROMYCIN 250 MG PO TABS: ORAL_TABLET | ORAL | 0 refills | Status: AC

## 2021-06-12 NOTE — Patient Instructions (Signed)
You are to take a z-pack; take 2 tabs today and 1 tablet starting tomorrow. Call if not better in one week.  ?

## 2021-06-12 NOTE — Progress Notes (Signed)
? ?Location:  [facility] ?  ?Place of Service:   clinic  ? ? ?CODE STATUS: full  ? ?No Known Allergies ? ?Chief Complaint  ?Patient presents with  ? Acute Visit  ?  Patient complains of sore throat, body ache, and headache. Symptoms started   ? ? ?HPI: ? ?For the past week she has had a sore throat. She has developed coughing; hoarseness; sinus drainage. There have been no fevers. She has taken otc products.   ? ?Past Medical History:  ?Diagnosis Date  ? Allergic rhinitis   ? Lower back pain   ? Thyroid disease   ? ? ?Past Surgical History:  ?Procedure Laterality Date  ? HERNIA REPAIR  1989   ? Dr.Kathleen Linton Rump   ? ? ?Social History  ? ?Socioeconomic History  ? Marital status: Single  ?  Spouse name: Not on file  ? Number of children: Not on file  ? Years of education: Not on file  ? Highest education level: Not on file  ?Occupational History  ? Not on file  ?Tobacco Use  ? Smoking status: Former  ?  Years: 2.00  ?  Types: Cigarettes  ?  Quit date: 10/30/2019  ?  Years since quitting: 1.6  ? Smokeless tobacco: Never  ?Vaping Use  ? Vaping Use: Never used  ?Substance and Sexual Activity  ? Alcohol use: Yes  ?  Alcohol/week: 0.0 standard drinks  ?  Comment: once a week  ? Drug use: Yes  ?  Types: Marijuana  ?  Comment: For migraines  ? Sexual activity: Yes  ?  Birth control/protection: None  ?Other Topics Concern  ? Not on file  ?Social History Narrative  ? Graves   ? ENT- Dr. Jodi Marble  ? ?Social Determinants of Health  ? ?Financial Resource Strain: Not on file  ?Food Insecurity: Not on file  ?Transportation Needs: Not on file  ?Physical Activity: Not on file  ?Stress: Not on file  ?Social Connections: Not on file  ?Intimate Partner Violence: Not on file  ? ?Family History  ?Problem Relation Age of Onset  ? Breast cancer Maternal Aunt   ? Hypertension Mother   ? ? ? ? ?VITAL SIGNS ?BP (!) 160/98   Pulse 87   Temp (!) 97.1 ?F (36.2 ?C)   Ht 5\' 6"  (1.676 m)   SpO2 98%   BMI 29.86 kg/m?   ? ?Outpatient Encounter Medications as of 06/12/2021  ?Medication Sig  ? azithromycin (ZITHROMAX) 250 MG tablet Take 2 tablets on day 1, then 1 tablet daily on days 2 through 5  ? labetalol (NORMODYNE) 100 MG tablet Take 1.5 tablets (150 mg total) by mouth 2 (two) times daily.  ? levothyroxine (SYNTHROID) 100 MCG tablet Take 1 tablet (100 mcg total) by mouth daily.  ? metFORMIN (GLUCOPHAGE-XR) 500 MG 24 hr tablet Take 500 mg by mouth daily.  ? [DISCONTINUED] Cholecalciferol (VITAMIN D3) 1.25 MG (50000 UT) CAPS Take 1 capsule by mouth once a week.  ? [DISCONTINUED] loratadine (CLARITIN) 10 MG tablet Take 1 tablet by mouth as needed.  ? ?No facility-administered encounter medications on file as of 06/12/2021.  ? ? ? ?SIGNIFICANT DIAGNOSTIC EXAMS ? ?Review of Systems  ?Constitutional:  Positive for malaise/fatigue. Negative for fever.  ?HENT:  Positive for congestion and sore throat.   ?Respiratory:  Positive for cough. Negative for sputum production and shortness of breath.   ?Cardiovascular:  Negative for chest pain, palpitations and leg swelling.  ?  Gastrointestinal:  Negative for abdominal pain, constipation and heartburn.  ?Musculoskeletal:  Negative for back pain, joint pain and myalgias.  ?Skin: Negative.   ?Neurological:  Negative for dizziness.  ?Psychiatric/Behavioral:  The patient is not nervous/anxious.   ? ? ?Physical Exam ?Constitutional:   ?   General: She is not in acute distress. ?   Appearance: She is well-developed. She is not diaphoretic.  ?HENT:  ?   Nose: Nose normal.  ?   Mouth/Throat:  ?   Mouth: Mucous membranes are moist.  ?   Pharynx: Oropharynx is clear.  ?Eyes:  ?   Conjunctiva/sclera: Conjunctivae normal.  ?Neck:  ?   Thyroid: No thyromegaly.  ?Cardiovascular:  ?   Rate and Rhythm: Normal rate and regular rhythm.  ?   Heart sounds: Normal heart sounds.  ?Pulmonary:  ?   Effort: Pulmonary effort is normal. No respiratory distress.  ?   Breath sounds: Normal breath sounds.  ?Abdominal:  ?    General: Bowel sounds are normal. There is no distension.  ?   Palpations: Abdomen is soft.  ?   Tenderness: There is no abdominal tenderness.  ?Musculoskeletal:     ?   General: Normal range of motion.  ?   Cervical back: Neck supple.  ?   Right lower leg: No edema.  ?   Left lower leg: No edema.  ?Lymphadenopathy:  ?   Cervical: Cervical adenopathy present.  ?Skin: ?   General: Skin is warm and dry.  ?Neurological:  ?   Mental Status: She is alert and oriented to person, place, and time.  ?Psychiatric:     ?   Mood and Affect: Mood normal.  ? ? ? ? ?ASSESSMENT/ PLAN: ? ?TODAY ? ?Acute sinusitis: will begin z-pack; she has been advised to call office back if she is not improving by next week.  ? ? ?Ok Edwards NP ?[facility]  ?call [phone]  ? ?

## 2021-06-13 LAB — SARS-COV-2 RNA,(COVID-19) QUALITATIVE NAAT: SARS CoV2 RNA: NOT DETECTED

## 2021-06-18 ENCOUNTER — Ambulatory Visit: Payer: BC Managed Care – PPO | Admitting: Nurse Practitioner

## 2022-02-03 ENCOUNTER — Other Ambulatory Visit: Payer: Self-pay | Admitting: Nurse Practitioner

## 2022-02-03 DIAGNOSIS — I10 Essential (primary) hypertension: Secondary | ICD-10-CM

## 2022-02-03 NOTE — Telephone Encounter (Signed)
Pharmacy requested refill

## 2022-03-07 ENCOUNTER — Ambulatory Visit: Payer: BC Managed Care – PPO | Admitting: Internal Medicine

## 2022-03-13 ENCOUNTER — Encounter: Payer: Self-pay | Admitting: Internal Medicine

## 2022-03-13 ENCOUNTER — Ambulatory Visit (INDEPENDENT_AMBULATORY_CARE_PROVIDER_SITE_OTHER): Payer: BC Managed Care – PPO | Admitting: Internal Medicine

## 2022-03-13 VITALS — BP 134/76 | HR 94 | Ht 66.0 in | Wt 183.8 lb

## 2022-03-13 DIAGNOSIS — E221 Hyperprolactinemia: Secondary | ICD-10-CM

## 2022-03-13 DIAGNOSIS — E039 Hypothyroidism, unspecified: Secondary | ICD-10-CM | POA: Diagnosis not present

## 2022-03-13 NOTE — Progress Notes (Unsigned)
Name: Mariah Rocha  MRN/ DOB: [id number], [birth year]    Age/ Sex: 36 y.o., female     PCP: Sharon Seller, NP   Reason for Endocrinology Evaluation: Hypothyroidism     Initial Endocrinology Clinic Visit: 06/04/2018    PATIENT IDENTIFIER: Mariah Rocha is a 36 y.o., female with a past medical history of Graves' disease. She has followed with [facility] since 06/04/2018 for consultative assistance with management of her hypothyroidism.   HISTORICAL SUMMARY: The patient was first diagnosed with hyperthyroidism secondary to Graves' disease in 2015.  She was started on Methimazole that she took for 2 years but stopped due to lack of insurance. She presented again to her PCP in 03/2018 for evaluation of headaches, during work up she was foun to have elevated TSH at 6.29 uIU/mL.  She was started on levothyroxine 25 MCG daily by her PCP.   Paternal aunt with thyroid disease.  SUBJECTIVE:     Today (03/13/2022):  Mariah Rocha is here for a follow up on hypothyroidism.  She continues to take levothyroxine on daily basis.   She has been noted with weight loss Denies local neck swelling  Denies constipation or diarrhea  Rare palpitations  She continues with fatigue   No prior pregnancies , she is interested in conception, she has a referral to infertility specialist   LMP last week  has been regular recently  Denies galactorrhea  Has occasional headaches with migraines Uses occasional cannabis for headaches   Levothyroxine 100 mcg daily    HISTORY:  Past Medical History:  Past Medical History:  Diagnosis Date   Allergic rhinitis    Lower back pain    Thyroid disease    Past Surgical History:  Past Surgical History:  Procedure Laterality Date   HERNIA REPAIR  1989    Dr.Kathleen Samuel Bouche    Social History:  reports that she quit smoking about 2 years ago. Her smoking use included cigarettes. She has never used smokeless tobacco. She reports current  alcohol use. She reports current drug use. Drug: Marijuana. Family History:  Family History  Problem Relation Age of Onset   Breast cancer Maternal Aunt    Hypertension Mother      HOME MEDICATIONS: Allergies as of 03/13/2022   No Known Allergies      Medication List        Accurate as of March 13, 2022 12:40 PM. If you have any questions, ask your nurse or doctor.          labetalol 100 MG tablet Commonly known as: NORMODYNE Take 1.5 tablets (150 mg total) by mouth 2 (two) times daily.   levothyroxine 100 MCG tablet Commonly known as: SYNTHROID Take 1 tablet (100 mcg total) by mouth daily.   metFORMIN 500 MG 24 hr tablet Commonly known as: GLUCOPHAGE-XR Take 500 mg by mouth daily.       PHYSICAL EXAM: VS: BP 134/76 (BP Location: Left Arm, Patient Position: Sitting, Cuff Size: Large)   Pulse 94   Ht 5\' 6"  (1.676 m)   Wt 183 lb 12.8 oz (83.4 kg)   SpO2 98%   BMI 29.67 kg/m    EXAM: General: Pt appears well and is in NAD  Eyes: External eye exam normal without stare, lid lag or exophthalmos.  EOM intact.    Neck: General: Supple without adenopathy. Thyroid: Thyroid gland normal.  Lungs: Clear with good BS bilat   Heart: Auscultation: RRR   Abdomen: Normoactive bowel sounds,  soft, nontender  Extremities: BL LE: no pretibial edema   Mental Status: Judgment, insight: intact Orientation: oriented to time, place, and person Mood and affect: no depression, anxiety, or agitation     DATA REVIEWED:   Latest Reference Range & Units 03/14/22 09:10  TSH 0.35 - 5.50 uIU/mL 4.82      Thyroid Ultrasound 03/01/2019  Heterogeneous thyroid indicating medical thyroid disease.  ASSESSMENT / PLAN / RECOMMENDATIONS:   Hypothyroidism:   Clinically euthyroid  No local neck symptoms  Preconception counseling performed, patient to increase LT-4 replacement by 20% with positive pregnancy test.  We discussed increased LT-4 requirements during pregnancy TSH  trending up again, will increase levothyroxine   Medications    Stop Levothyroxine 100 mcg daily  Start Levothyroxine 112 mcg daily    2. Hyperprolactinemia :  - This could be secondary to  hypothyroidism  vs primary. Will adjust LT-4 replacement and recheck levels in 2 months     F/u in 1 yr   Signed electronically by: Lyndle Herrlich, MD  [facility]  [facility] [address] [place] [place], [place] [address] Phone: [phone] FAX: [phone]   CC: Sharon Seller, NP [address] [place] [address] Phone: [phone] Fax: [phone]   Return to Endocrinology clinic as below: Future Appointments  Date Time Provider Department Center  05/30/2022  3:20 PM Sharon Seller, NP [facility] None

## 2022-03-13 NOTE — Patient Instructions (Signed)

## 2022-03-14 ENCOUNTER — Other Ambulatory Visit (INDEPENDENT_AMBULATORY_CARE_PROVIDER_SITE_OTHER): Payer: BC Managed Care – PPO

## 2022-03-14 ENCOUNTER — Other Ambulatory Visit: Payer: Self-pay | Admitting: Internal Medicine

## 2022-03-14 DIAGNOSIS — E221 Hyperprolactinemia: Secondary | ICD-10-CM

## 2022-03-14 DIAGNOSIS — E039 Hypothyroidism, unspecified: Secondary | ICD-10-CM

## 2022-03-14 LAB — TSH: TSH: 4.82 u[IU]/mL (ref 0.35–5.50)

## 2022-03-14 MED ORDER — LEVOTHYROXINE SODIUM 112 MCG PO TABS: 112.0000 ug | ORAL_TABLET | Freq: Every day | ORAL | 3 refills | Status: DC

## 2022-03-15 LAB — PROLACTIN: Prolactin: 6.6 ng/mL

## 2022-03-17 ENCOUNTER — Encounter: Payer: Self-pay | Admitting: Family

## 2022-03-17 ENCOUNTER — Ambulatory Visit (INDEPENDENT_AMBULATORY_CARE_PROVIDER_SITE_OTHER): Payer: BC Managed Care – PPO | Admitting: Family

## 2022-03-17 VITALS — BP 122/86 | HR 87 | Temp 97.0°F | Resp 16 | Ht 66.0 in | Wt 181.2 lb

## 2022-03-17 DIAGNOSIS — R6883 Chills (without fever): Secondary | ICD-10-CM

## 2022-03-17 DIAGNOSIS — R519 Headache, unspecified: Secondary | ICD-10-CM | POA: Diagnosis not present

## 2022-03-17 DIAGNOSIS — J029 Acute pharyngitis, unspecified: Secondary | ICD-10-CM | POA: Diagnosis not present

## 2022-03-17 DIAGNOSIS — R0981 Nasal congestion: Secondary | ICD-10-CM

## 2022-03-17 DIAGNOSIS — R059 Cough, unspecified: Secondary | ICD-10-CM | POA: Diagnosis not present

## 2022-03-17 LAB — POCT INFLUENZA A/B
Influenza A, POC: NEGATIVE
Influenza B, POC: NEGATIVE

## 2022-03-17 LAB — POCT RAPID STREP A (OFFICE): Rapid Strep A Screen: NEGATIVE

## 2022-03-17 LAB — POC COVID19 BINAXNOW: SARS Coronavirus 2 Ag: NEGATIVE

## 2022-03-17 MED ORDER — LORATADINE 10 MG PO TABS: 10.0000 mg | ORAL_TABLET | Freq: Every day | ORAL | 11 refills | Status: DC

## 2022-03-17 MED ORDER — AMOXICILLIN-POT CLAVULANATE 875-125 MG PO TABS: 1.0000 | ORAL_TABLET | Freq: Two times a day (BID) | ORAL | 0 refills | Status: DC

## 2022-03-17 NOTE — Patient Instructions (Signed)
-    Gurgle throat with warm water and salt at least once daily   - Take OTC tylenol as needed for pain   - Encouraged to increase fluid intake/warm tea or soup   - Notify provider if symptoms worsen or fail to improve

## 2022-03-17 NOTE — Progress Notes (Signed)
Provider: Sugar Vanzandt FNP-C  Sharon Seller, NP  Patient Care Team: Sharon Seller, NP as PCP - General (Geriatric Medicine)  Extended Emergency Contact Information Primary Emergency Contact: Elgin,[name] Address: [address]          [place], [place] [address] [place] Home Phone: [phone] Relation: Mother  Code Status:  Full Code  Goals of care: Advanced Directive information    03/17/2022   10:56 AM  Advanced Directives  Does Patient Have a Medical Advance Directive? No  Would patient like information on creating a medical advance directive? No - Patient declined     Chief Complaint  Patient presents with   Acute Visit    Headache,nasal congestion,chills,cough and sore throat     HPI:  Pt is a 36 y.o. female seen today for an acute visit for evaluation of nasal congestion ,sore throat,cough,headache,chills x 4 days  Has been taking Benadryl at night but still cough wakes her up at night. 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   Past Medical History:  Diagnosis Date   Allergic rhinitis    Lower back pain    Thyroid disease    Past Surgical History:  Procedure Laterality Date   HERNIA REPAIR  1989    Dr.Kathleen Lucas     No Known Allergies  Outpatient Encounter Medications as of 03/17/2022  Medication Sig   amoxicillin-clavulanate (AUGMENTIN) 875-125 MG tablet Take 1 tablet by mouth 2 (two) times daily.   labetalol (NORMODYNE) 100 MG tablet Take 1.5 tablets (150 mg total) by mouth 2 (two) times daily.   levothyroxine (SYNTHROID) 112 MCG tablet Take 1 tablet (112 mcg total) by mouth daily.   loratadine (CLARITIN) 10 MG tablet Take 1 tablet (10 mg total) by mouth daily.   metFORMIN (GLUCOPHAGE-XR) 500 MG 24 hr tablet Take 500 mg by mouth daily.   No facility-administered encounter medications on file as of 03/17/2022.    Review of Systems  Constitutional:  Positive for chills and fatigue. Negative for  appetite change, fever and unexpected weight change.  HENT:  Positive for congestion, rhinorrhea and sore throat. Negative for ear discharge, ear pain, hearing loss, nosebleeds, postnasal drip, sinus pressure, sinus pain, sneezing, tinnitus and trouble swallowing.   Eyes:  Negative for pain, discharge, redness, itching and visual disturbance.  Respiratory:  Positive for cough. Negative for chest tightness, shortness of breath and wheezing.   Cardiovascular:  Negative for chest pain, palpitations and leg swelling.  Gastrointestinal:  Negative for abdominal distention, abdominal pain, diarrhea, nausea and vomiting.  Skin:  Negative for color change, pallor, rash and wound.  Neurological:  Positive for headaches. Negative for dizziness, syncope, speech difficulty, weakness, light-headedness and numbness.  Hematological:  Does not bruise/bleed easily.    Immunization History  Administered Date(s) Administered   Influenza,inj,Quad PF,6+ Mos 01/12/2013, 02/22/2014, 04/03/2015, 04/03/2016, 02/14/2019, 03/02/2020, 03/04/2021   Influenza-Unspecified 12/25/2011   PFIZER(Purple Top)SARS-COV-2 Vaccination 06/30/2019, 07/25/2019, 03/10/2020   PPD Test 12/29/2011, 09/16/2019   Tdap 01/12/2013   Pertinent  Health Maintenance Due  Topic Date Due   INFLUENZA VACCINE  10/29/2021   PAP SMEAR-Modifier  05/30/2022      08/22/2019   11:02 AM 03/04/2021    3:32 PM 04/02/2021    2:44 PM 05/24/2021    4:02 PM 03/17/2022   10:56 AM  Fall Risk  Falls in the past year? 0 0 0 0 0  Was there an injury with Fall? 0 0 0 0 0  Fall Risk Category Calculator 0 0 0 0 0  Fall Risk Category Low Low Low Low Low  Patient Fall Risk Level Low fall risk Low fall risk Low fall risk Low fall risk Low fall risk  Patient at Risk for Falls Due to  No Fall Risks No Fall Risks No Fall Risks No Fall Risks  Fall risk Follow up  Falls evaluation completed Falls evaluation completed Falls evaluation completed Falls evaluation completed    Functional Status Survey:    Vitals:   03/17/22 1052  BP: 122/86  Pulse: 87  Resp: 16  Temp: (!) 97 F (36.1 C)  SpO2: 99%  Weight: 181 lb 3.2 oz (82.2 kg)  Height: 5\' 6"  (1.676 m)   Body mass index is 29.25 kg/m. Physical Exam Vitals reviewed.  Constitutional:      General: She is not in acute distress.    Appearance: Normal appearance. She is normal weight. She is not ill-appearing or diaphoretic.  HENT:     Head: Normocephalic.     Right Ear: Tympanic membrane, ear canal and external ear normal. There is no impacted cerumen.     Left Ear: Tympanic membrane, ear canal and external ear normal. There is no impacted cerumen.     Nose: Congestion and rhinorrhea present.     Mouth/Throat:     Mouth: Mucous membranes are moist.     Pharynx: Oropharynx is clear. Posterior oropharyngeal erythema present. No oropharyngeal exudate.  Eyes:     General: No scleral icterus.       Right eye: No discharge.        Left eye: No discharge.     Extraocular Movements: Extraocular movements intact.     Conjunctiva/sclera: Conjunctivae normal.     Pupils: Pupils are equal, round, and reactive to light.  Neck:     Vascular: No carotid bruit.  Cardiovascular:     Rate and Rhythm: Normal rate and regular rhythm.     Pulses: Normal pulses.     Heart sounds: Normal heart sounds. No murmur heard.    No friction rub. No gallop.  Pulmonary:     Effort: Pulmonary effort is normal. No respiratory distress.     Breath sounds: Normal breath sounds. No wheezing, rhonchi or rales.  Chest:     Chest wall: No tenderness.  Abdominal:     General: Bowel sounds are normal. There is no distension.     Palpations: Abdomen is soft. There is no mass.     Tenderness: There is no abdominal tenderness. There is no right CVA tenderness, left CVA tenderness, guarding or rebound.  Musculoskeletal:        General: No swelling or tenderness. Normal range of motion.     Cervical back: Normal range of motion.  No rigidity or tenderness.     Right lower leg: No edema.     Left lower leg: No edema.  Lymphadenopathy:     Cervical: No cervical adenopathy.  Skin:    General: Skin is warm and dry.     Coloration: Skin is not pale.     Findings: No bruising, erythema, lesion or rash.  Neurological:     Mental Status: She is alert and oriented to person, place, and time.     Motor: No weakness.     Gait: Gait normal.  Psychiatric:        Mood and Affect: Mood normal.        Speech: Speech normal.  Behavior: Behavior normal.     Labs reviewed: Recent Labs    05/16/21 1550  NA 138  K 4.3  CL 104  CO2 30  GLUCOSE 93  BUN 14  CREATININE 0.82  CALCIUM 9.5   Recent Labs    05/16/21 1550  AST 20  ALT 21  ALKPHOS 37*  BILITOT 0.4  PROT 7.2  ALBUMIN 4.5   Recent Labs    05/16/21 1550  WBC 6.4  HGB 11.3*  HCT 34.3*  MCV 94.0  PLT 276.0   Lab Results  Component Value Date   TSH 4.82 03/14/2022   No results found for: "HGBA1C" Lab Results  Component Value Date   CHOL 175 02/22/2020   HDL 58 02/22/2020   LDLCALC 102 (H) 02/22/2020   TRIG 68 02/22/2020   CHOLHDL 3.0 02/22/2020    Significant Diagnostic Results in last 30 days:  No results found.  Assessment/Plan  1. Cough, unspecified type Afebrile  - bilateral lungs clear to Auscultation - POC COVID-19 negative  - POC Influenza A/B results negative  - POC Rapid Strep A results negative   2. Chills (without fever) Afebrile  - OTC Tylenol as needed  - POC COVID-19 negative  - POC Influenza A/B results negative  - POC Rapid Strep A results negative   3. Sore throat Erythema noted without any exudate  - Advised to gurgle throat with warm water and salt at least once daily  - take OTC tylenol as needed for pain  - Encouraged to increase fluid intake/warm tea or soup  - Notify provider if symptoms worsen or fail to improve - POC COVID-19 negative  - POC Influenza A/B results negative  - POC Rapid  Strep A results negative   4. Nonintractable headache, unspecified chronicity pattern, unspecified headache type Advised to take OTC Tylenol as needed  - Increase Fluid intake  - POC COVID-19 negative  - POC Influenza A/B results negative  - POC Rapid Strep A results negative   5. Nasal congestion - encouraged to increase fluid intake  - POC COVID-19 negative  - POC Influenza A/B results negative  - POC Rapid Strep A results negative   Family/ staff Communication: Reviewed plan of care with patient verbalized understanding   Labs/tests ordered:  - POC COVID-19  - POC Influenza A/B  - POC Rapid Strep A   Next Appointment: Return if symptoms worsen or fail to improve.   Caesar Bookman, NP

## 2022-03-26 ENCOUNTER — Encounter: Payer: Self-pay | Admitting: Family

## 2022-03-26 ENCOUNTER — Ambulatory Visit (INDEPENDENT_AMBULATORY_CARE_PROVIDER_SITE_OTHER): Payer: BC Managed Care – PPO | Admitting: Family

## 2022-03-26 VITALS — BP 130/82 | HR 84 | Temp 97.0°F | Resp 16 | Ht 66.0 in | Wt 185.2 lb

## 2022-03-26 DIAGNOSIS — R059 Cough, unspecified: Secondary | ICD-10-CM

## 2022-03-26 DIAGNOSIS — R0981 Nasal congestion: Secondary | ICD-10-CM

## 2022-03-26 DIAGNOSIS — J0100 Acute maxillary sinusitis, unspecified: Secondary | ICD-10-CM

## 2022-03-26 DIAGNOSIS — J029 Acute pharyngitis, unspecified: Secondary | ICD-10-CM

## 2022-03-26 MED ORDER — SALINE NASAL SPRAY 0.65 % NA SOLN: 1.0000 | NASAL | 12 refills | Status: DC | PRN

## 2022-03-26 MED ORDER — AZITHROMYCIN 250 MG PO TABS: ORAL_TABLET | ORAL | 0 refills | Status: AC

## 2022-03-26 NOTE — Progress Notes (Signed)
Provider: Renan Danese FNP-C  Sharon Seller, NP  Patient Care Team: Sharon Seller, NP as PCP - General (Geriatric Medicine)  Extended Emergency Contact Information Primary Emergency Contact: Schoenberg,[name] Address: [address]          [place], [place] [address] [place] Home Phone: [phone] Relation: Mother  Code Status:  Full Code  Goals of care: Advanced Directive information    03/26/2022    1:55 PM  Advanced Directives  Does Patient Have a Medical Advance Directive? No  Would patient like information on creating a medical advance directive? No - Patient declined     Chief Complaint  Patient presents with   Follow-up    Patient complains of ongoing headache, nasal congestion, chills, cough, and sore throat. Patient last seen in office for same symptoms 03/17/2022.    HPI:  Pt is a 36 y.o. female seen today for an acute visit for evaluation of ongoing cough,nasal congestion,headache,chills and sore throat.she was here with similar symptoms on 03/17/2022 was treated with 7 days course of Augmentin.states symptoms improved on the first day but then worsened.   Has been taking tylenol for body aches. No exposure to sick person with COVID-19   Past Medical History:  Diagnosis Date   Allergic rhinitis    Lower back pain    Thyroid disease    Past Surgical History:  Procedure Laterality Date   HERNIA REPAIR  1989    Dr.Kathleen Lucas     No Known Allergies  Outpatient Encounter Medications as of 03/26/2022  Medication Sig   amoxicillin-clavulanate (AUGMENTIN) 875-125 MG tablet Take 1 tablet by mouth 2 (two) times daily.   labetalol (NORMODYNE) 100 MG tablet Take 1.5 tablets (150 mg total) by mouth 2 (two) times daily.   levothyroxine (SYNTHROID) 112 MCG tablet Take 1 tablet (112 mcg total) by mouth daily.   loratadine (CLARITIN) 10 MG tablet Take 1 tablet (10 mg total) by mouth daily.   metFORMIN (GLUCOPHAGE-XR) 500 MG 24 hr tablet  Take 500 mg by mouth daily.   No facility-administered encounter medications on file as of 03/26/2022.    Review of Systems  Constitutional:  Positive for chills and fatigue. Negative for appetite change, fever and unexpected weight change.  HENT:  Positive for congestion, rhinorrhea and sore throat. Negative for dental problem, ear discharge, ear pain, facial swelling, hearing loss, nosebleeds, postnasal drip, sinus pressure, sinus pain, sneezing, tinnitus and trouble swallowing.   Eyes:  Negative for pain, discharge, redness, itching and visual disturbance.  Respiratory:  Positive for cough. Negative for chest tightness, shortness of breath and wheezing.   Cardiovascular:  Negative for chest pain, palpitations and leg swelling.  Gastrointestinal:  Negative for abdominal distention, abdominal pain, diarrhea, nausea and vomiting.  Skin:  Negative for color change, pallor and rash.  Neurological:  Negative for dizziness, light-headedness and headaches.    Immunization History  Administered Date(s) Administered   Influenza,inj,Quad PF,6+ Mos 01/12/2013, 02/22/2014, 04/03/2015, 04/03/2016, 02/14/2019, 03/02/2020, 03/04/2021   Influenza-Unspecified 12/25/2011   PFIZER(Purple Top)SARS-COV-2 Vaccination 06/30/2019, 07/25/2019, 03/10/2020   PPD Test 12/29/2011, 09/16/2019   Tdap 01/12/2013   Pertinent  Health Maintenance Due  Topic Date Due   INFLUENZA VACCINE  10/29/2021   PAP SMEAR-Modifier  05/30/2022      03/04/2021    3:32 PM 04/02/2021    2:44 PM 05/24/2021    4:02 PM 03/17/2022   10:56 AM 03/26/2022    1:55 PM  Fall Risk  Falls in the  past year? 0 0 0 0 0  Was there an injury with Fall? 0 0 0 0 0  Fall Risk Category Calculator 0 0 0 0 0  Fall Risk Category Low Low Low Low Low  Patient Fall Risk Level Low fall risk Low fall risk Low fall risk Low fall risk Low fall risk  Patient at Risk for Falls Due to No Fall Risks No Fall Risks No Fall Risks No Fall Risks No Fall Risks  Fall  risk Follow up Falls evaluation completed Falls evaluation completed Falls evaluation completed Falls evaluation completed Falls evaluation completed   Functional Status Survey:    Vitals:   03/26/22 1349  BP: 130/82  Pulse: 84  Resp: 16  Temp: (!) 97 F (36.1 C)  SpO2: 97%  Weight: 185 lb 3.2 oz (84 kg)  Height: 5\' 6"  (1.676 m)   Body mass index is 29.89 kg/m. Physical Exam Vitals reviewed.  Constitutional:      General: She is not in acute distress.    Appearance: Normal appearance. She is normal weight. She is not ill-appearing or diaphoretic.  HENT:     Head: Normocephalic.     Right Ear: Tympanic membrane, ear canal and external ear normal. There is no impacted cerumen.     Left Ear: Tympanic membrane, ear canal and external ear normal. There is no impacted cerumen.     Nose: Congestion and rhinorrhea present.     Right Turbinates: Not enlarged, swollen or pale.     Left Turbinates: Not enlarged, swollen or pale.     Right Sinus: Maxillary sinus tenderness present. No frontal sinus tenderness.     Left Sinus: Maxillary sinus tenderness present. No frontal sinus tenderness.     Mouth/Throat:     Mouth: Mucous membranes are moist.     Pharynx: Oropharynx is clear. Posterior oropharyngeal erythema present. No oropharyngeal exudate.  Eyes:     General: No scleral icterus.       Right eye: No discharge.        Left eye: No discharge.     Extraocular Movements: Extraocular movements intact.     Conjunctiva/sclera: Conjunctivae normal.     Pupils: Pupils are equal, round, and reactive to light.  Cardiovascular:     Rate and Rhythm: Normal rate and regular rhythm.     Pulses: Normal pulses.     Heart sounds: Normal heart sounds. No murmur heard.    No friction rub. No gallop.  Pulmonary:     Effort: Pulmonary effort is normal. No respiratory distress.     Breath sounds: Normal breath sounds. No wheezing, rhonchi or rales.  Chest:     Chest wall: No tenderness.   Abdominal:     General: Bowel sounds are normal. There is no distension.     Palpations: Abdomen is soft. There is no mass.     Tenderness: There is no abdominal tenderness. There is no right CVA tenderness, left CVA tenderness, guarding or rebound.  Musculoskeletal:        General: No swelling or tenderness. Normal range of motion.     Cervical back: Normal range of motion. No rigidity or tenderness.     Right lower leg: No edema.     Left lower leg: No edema.  Lymphadenopathy:     Cervical: No cervical adenopathy.  Skin:    General: Skin is warm and dry.     Coloration: Skin is not pale.     Findings: No erythema  or rash.  Neurological:     Mental Status: She is alert and oriented to person, place, and time.     Motor: No weakness.     Gait: Gait normal.  Psychiatric:        Mood and Affect: Mood normal.        Speech: Speech normal.        Behavior: Behavior normal.     Labs reviewed: Recent Labs    05/16/21 1550  NA 138  K 4.3  CL 104  CO2 30  GLUCOSE 93  BUN 14  CREATININE 0.82  CALCIUM 9.5   Recent Labs    05/16/21 1550  AST 20  ALT 21  ALKPHOS 37*  BILITOT 0.4  PROT 7.2  ALBUMIN 4.5   Recent Labs    05/16/21 1550  WBC 6.4  HGB 11.3*  HCT 34.3*  MCV 94.0  PLT 276.0   Lab Results  Component Value Date   TSH 4.82 03/14/2022   No results found for: "HGBA1C" Lab Results  Component Value Date   CHOL 175 02/22/2020   HDL 58 02/22/2020   LDLCALC 102 (H) 02/22/2020   TRIG 68 02/22/2020   CHOLHDL 3.0 02/22/2020    Significant Diagnostic Results in last 30 days:  No results found.  Assessment/Plan 1. Nasal congestion Advised to use remain nasal spray as below then used Flonase as prescribed - Loratadine 10 mg tablet one by mouth daily x 14 days  - sodium chloride (OCEAN) 0.65 % nasal spray; Place 1 spray into the nose as needed for congestion.  Dispense: 30 mL; Refill: 12  2. Cough, unspecified type Afebrile Suspect cold from nasal  drainage -Over-the-counter Robitussin DM as needed for cough Bilateral lungs clear to auscultation  3. Sore throat Oropharynx erythema noted without any exudate - Advised to gurgle throat with warm water and salt at least once daily  - take OTC tylenol as needed for pain  - Encouraged to increase fluid intake/warm tea or soup  - Notify provider if symptoms worsen or fail to improve  4. Acute non-recurrent maxillary sinusitis Bilateral maxillary sinus tender to palpation -Will start on Z-Pak as below side effects discussed. Advised to notify provider if symptoms worsen or fail to improve - azithromycin (ZITHROMAX) 250 MG tablet; Take 2 tablets on day 1, then 1 tablet daily on days 2 through 5  Dispense: 6 tablet; Refill: 0  Family/ staff Communication: Reviewed plan of care with patient verbalized understanding  Labs/tests ordered: None   Next Appointment:Return if symptoms worsen or fail to improve.   Sandrea Hughs, NP

## 2022-03-26 NOTE — Patient Instructions (Signed)
-   continue with Tylenol for fever ,chills or body aches   - Loratadine 10 mg tablet one by mouth daily x 14 days   - Use saline nasal rinse twice daily and as needed for nasal congestion

## 2022-04-04 ENCOUNTER — Ambulatory Visit: Payer: BC Managed Care – PPO | Admitting: Adult Health

## 2022-05-30 ENCOUNTER — Ambulatory Visit (INDEPENDENT_AMBULATORY_CARE_PROVIDER_SITE_OTHER): Payer: BC Managed Care – PPO | Admitting: Nurse Practitioner

## 2022-05-30 ENCOUNTER — Encounter: Payer: Self-pay | Admitting: Nurse Practitioner

## 2022-05-30 VITALS — BP 130/94 | HR 85 | Temp 97.9°F | Ht 66.5 in | Wt 194.0 lb

## 2022-05-30 DIAGNOSIS — M79672 Pain in left foot: Secondary | ICD-10-CM

## 2022-05-30 DIAGNOSIS — I10 Essential (primary) hypertension: Secondary | ICD-10-CM

## 2022-05-30 DIAGNOSIS — M79671 Pain in right foot: Secondary | ICD-10-CM

## 2022-05-30 DIAGNOSIS — Z0000 Encounter for general adult medical examination without abnormal findings: Secondary | ICD-10-CM

## 2022-05-30 DIAGNOSIS — R519 Headache, unspecified: Secondary | ICD-10-CM

## 2022-05-30 DIAGNOSIS — R0981 Nasal congestion: Secondary | ICD-10-CM | POA: Diagnosis not present

## 2022-05-30 DIAGNOSIS — Z6830 Body mass index (BMI) 30.0-30.9, adult: Secondary | ICD-10-CM

## 2022-05-30 DIAGNOSIS — E6609 Other obesity due to excess calories: Secondary | ICD-10-CM

## 2022-05-30 DIAGNOSIS — Z1159 Encounter for screening for other viral diseases: Secondary | ICD-10-CM

## 2022-05-30 MED ORDER — NURTEC 75 MG PO TBDP: 75.0000 mg | ORAL_TABLET | Freq: Every day | ORAL | 1 refills | Status: DC | PRN

## 2022-05-30 NOTE — Progress Notes (Signed)
Careteam: Patient Care Team: Lauree Chandler, NP as PCP - General (Geriatric Medicine)  PLACE OF SERVICE:  [place] Directive information Does Patient Have a Medical Advance Directive?: No, Would patient like information on creating a medical advance directive?: No - Patient declined  No Known Allergies  Chief Complaint  Patient presents with   Annual Exam    Yearly physical, Discuss need for hep c screening, flu vaccine, covid booster and pap smear (has GYN, will schedule in the near future). No menstrual since December (1st week). Request to recheck TSH      HPI: Patient is a 37 y.o. female for yearly physical.   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   Htn- last month she was checking reguarly and blood pressure was 120/80s  Placed on metformin to help regulate her cycle because she is trying to get pregnant. Last time she took pregnancy test it was negative.   Having a lot of nasal congestion/allergies. Blowing her nose frequently  Works a job where she is on her feet a lot on the weekends and having more pain in her feet while working   Review of Systems:  Review of Systems  Constitutional:  Negative for chills, fever and weight loss.  HENT:  Positive for congestion. Negative for tinnitus.   Respiratory:  Negative for cough, sputum production and shortness of breath.   Cardiovascular:  Negative for chest pain, palpitations and leg swelling.  Gastrointestinal:  Negative for abdominal pain, constipation, diarrhea and heartburn.  Genitourinary:  Negative for dysuria, frequency and urgency.  Musculoskeletal:  Negative for back pain, falls, joint pain and myalgias.  Skin: Negative.   Neurological:  Positive for headaches. Negative for dizziness.  Psychiatric/Behavioral:  Negative for depression and memory loss. The patient does not have insomnia.     Past Medical History:   Diagnosis Date   Allergic rhinitis    Lower back pain    Thyroid disease    Past Surgical History:  Procedure Laterality Date   [place]    Social History:   reports that she quit smoking about 2 years ago. Her smoking use included cigarettes. She has never used smokeless tobacco. She reports current alcohol use. She reports current drug use. Drug: Marijuana.  Family History  Problem Relation Age of Onset   Breast cancer Maternal Aunt    Hypertension Mother     Medications: Patient's Medications  New Prescriptions   No medications on file  Previous Medications   LABETALOL (NORMODYNE) 100 MG TABLET    Take 1.5 tablets (150 mg total) by mouth 2 (two) times daily.   LEVOTHYROXINE (SYNTHROID) 112 MCG TABLET    Take 1 tablet (112 mcg total) by mouth daily.   LORATADINE (CLARITIN) 10 MG TABLET    Take 1 tablet (10 mg total) by mouth daily.   METFORMIN (GLUCOPHAGE-XR) 500 MG 24 HR TABLET    Take 500 mg by mouth daily.   SODIUM CHLORIDE (OCEAN) 0.65 % NASAL SPRAY    Place 1 spray into the nose as needed for congestion.   SUMATRIPTAN (IMITREX) 20 MG/ACT NASAL SPRAY    Place 20 mg into the nose every 2 (two) hours as needed for migraine or headache. May repeat in 2 hours if headache persists or recurs.  Modified Medications   No medications on file  Discontinued Medications  AMOXICILLIN-CLAVULANATE (AUGMENTIN) 875-125 MG TABLET    Take 1 tablet by mouth 2 (two) times daily.    Physical Exam:  Vitals:   05/30/22 1524 05/30/22 1529  BP: (!) 128/92 (!) 130/94  Pulse: 85   Temp: 97.9 F (36.6 C)   TempSrc: Temporal   SpO2: 98%   Weight: 194 lb (88 kg)   Height: 5' 6.5" (1.689 m)    Body mass index is 30.84 kg/m. Wt Readings from Last 3 Encounters:  05/30/22 194 lb (88 kg)  03/26/22 185 lb 3.2 oz (84 kg)  03/17/22 181 lb 3.2 oz (82.2 kg)    Physical Exam Constitutional:      General: She is not in acute distress.    Appearance: She is  well-developed. She is not diaphoretic.  HENT:     Head: Normocephalic and atraumatic.     Right Ear: Tympanic membrane, ear canal and external ear normal.     Left Ear: Tympanic membrane, ear canal and external ear normal.     Nose: Congestion present. No rhinorrhea.     Mouth/Throat:     Mouth: Mucous membranes are moist.     Pharynx: No oropharyngeal exudate.  Eyes:     Conjunctiva/sclera: Conjunctivae normal.     Pupils: Pupils are equal, round, and reactive to light.  Cardiovascular:     Rate and Rhythm: Normal rate and regular rhythm.     Heart sounds: Normal heart sounds.  Pulmonary:     Effort: Pulmonary effort is normal.     Breath sounds: Normal breath sounds.  Abdominal:     General: Bowel sounds are normal.     Palpations: Abdomen is soft.  Musculoskeletal:     Cervical back: Normal range of motion and neck supple.     Right lower leg: No edema.     Left lower leg: No edema.  Skin:    General: Skin is warm and dry.  Neurological:     Mental Status: She is alert and oriented to person, place, and time.  Psychiatric:        Mood and Affect: Mood normal.     Labs reviewed: Basic Metabolic Panel: Recent Labs    03/14/22 0910  TSH 4.82   Liver Function Tests: No results for input(s): "AST", "ALT", "ALKPHOS", "BILITOT", "PROT", "ALBUMIN" in the last 8760 hours. No results for input(s): "LIPASE", "AMYLASE" in the last 8760 hours. No results for input(s): "AMMONIA" in the last 8760 hours. CBC: No results for input(s): "WBC", "NEUTROABS", "HGB", "HCT", "MCV", "PLT" in the last 8760 hours. Lipid Panel: No results for input(s): "CHOL", "HDL", "LDLCALC", "TRIG", "CHOLHDL", "LDLDIRECT" in the last 8760 hours. TSH: Recent Labs    03/14/22 0910  TSH 4.82   A1C: No results found for: "HGBA1C"   Assessment/Plan 1. Essential hypertension --Blood pressure elevated today but typically well controlled -home blood pressures are well controlled -No changes to  medications today  -will have pt continue to monitor home bp goal <140/90, to notify if readings remain high on 3 different days  -follow metabolic panel - COMPLETE METABOLIC PANEL WITH GFR - CBC with Differential/Platelet  2. Nonintractable headache, unspecified chronicity pattern, unspecified headache type - Ambulatory referral to Neurology - Rimegepant Sulfate (NURTEC) 75 MG TBDP; Take 1 tablet (75 mg total) by mouth daily as needed (migraine).  Dispense: 30 tablet; Refill: 1  3. Preventative health care -wellness visit completed today, The patient was counseled regarding the appropriate use of alcohol, regular self-examination of the  breasts on a monthly basis, prevention of dental and periodontal disease, diet, regular sustained exercise for at least 30 minutes 5 times per week, routine screening interval for mammogram as recommended by the [place] and ACOG, importance of regular PAP smears,smoking cessation, tobacco use,  and recommended schedule for GI hemoccult testing, colonoscopy, cholesterol, thyroid and diabetes screening. - Lipid panel  4. Nasal congestion Do not forcefully blow your nose Use flonase nasal spray 1 spray to each nare twice daily as needed congestion Can try switching to xyzal 5 mg OTC for 2 weeks to help with congestion To use netti pot daily with saline nasal spray as needed  5. Need for hepatitis C screening test - Hepatitis C Antibody  6. Class 1 obesity due to excess calories with serious comorbidity and body mass index (BMI) of 30.0 to 30.9 in adult --education provided on healthy weight loss through increase in physical activity and proper nutrition   7. Pain in both feet -recommended shoes with good support to help pain with prolong standing.    Return in about 6 months (around 11/30/2022) for routine follow up .  Carlos American. [place], [facility] [phone]

## 2022-05-30 NOTE — Patient Instructions (Addendum)
Shoes with good support  hoku brooks new balance asisc    Do not forcefully blow your nose Use flonase nasal spray 1 spray to each nare twice daily as needed congestion Can try switching to xyzal 5 mg OTC for 2 weeks to help with congestion To use netti pot daily with saline nasal spray as needed

## 2022-05-31 LAB — COMPLETE METABOLIC PANEL WITH GFR
AG Ratio: 1.6 (calc) (ref 1.0–2.5)
ALT: 18 U/L (ref 6–29)
AST: 22 U/L (ref 10–30)
Albumin: 4.5 g/dL (ref 3.6–5.1)
Alkaline phosphatase (APISO): 41 U/L (ref 31–125)
BUN: 11 mg/dL (ref 7–25)
CO2: 27 mmol/L (ref 20–32)
Calcium: 9.5 mg/dL (ref 8.6–10.2)
Chloride: 104 mmol/L (ref 98–110)
Creat: 0.81 mg/dL (ref 0.50–0.97)
Globulin: 2.8 g/dL (calc) (ref 1.9–3.7)
Glucose, Bld: 83 mg/dL (ref 65–99)
Potassium: 4.4 mmol/L (ref 3.5–5.3)
Sodium: 139 mmol/L (ref 135–146)
Total Bilirubin: 0.5 mg/dL (ref 0.2–1.2)
Total Protein: 7.3 g/dL (ref 6.1–8.1)
eGFR: 98 mL/min/{1.73_m2} (ref >=60)

## 2022-05-31 LAB — CBC WITH DIFFERENTIAL/PLATELET
Absolute Monocytes: 482 cells/uL (ref 200–950)
Basophils Absolute: 42 cells/uL (ref 0–200)
Basophils Relative: 0.8 %
Eosinophils Absolute: 191 cells/uL (ref 15–500)
Eosinophils Relative: 3.6 %
HCT: 34.5 % — ABNORMAL LOW (ref 35.0–45.0)
Hemoglobin: 12 g/dL (ref 11.7–15.5)
Lymphs Abs: 2131 cells/uL (ref 850–3900)
MCH: 31.2 pg (ref 27.0–33.0)
MCHC: 34.8 g/dL (ref 32.0–36.0)
MCV: 89.6 fL (ref 80.0–100.0)
MPV: 10.9 fL (ref 7.5–12.5)
Monocytes Relative: 9.1 %
Neutro Abs: 2454 cells/uL (ref 1500–7800)
Neutrophils Relative %: 46.3 %
Platelets: 306 10*3/uL (ref 140–400)
RBC: 3.85 10*6/uL (ref 3.80–5.10)
RDW: 12.3 % (ref 11.0–15.0)
Total Lymphocyte: 40.2 %
WBC: 5.3 10*3/uL (ref 3.8–10.8)

## 2022-05-31 LAB — LIPID PANEL
Cholesterol: 183 mg/dL (ref <200)
HDL: 70 mg/dL (ref >=50)
LDL Cholesterol (Calc): 90 mg/dL (calc)
Non-HDL Cholesterol (Calc): 113 mg/dL (calc) (ref <130)
Total CHOL/HDL Ratio: 2.6 (calc) (ref <5.0)
Triglycerides: 132 mg/dL (ref <150)

## 2022-05-31 LAB — HEPATITIS C ANTIBODY: Hepatitis C Ab: NONREACTIVE

## 2022-06-02 ENCOUNTER — Encounter: Payer: Self-pay | Admitting: Neurology

## 2022-06-03 NOTE — Progress Notes (Unsigned)
NEUROLOGY CONSULTATION NOTE  Mariah Rocha MRN: [id number] DOB: 06-13-1985  Referring provider: Sherrie Mustache, NP Primary care provider: Sherrie Mustache, NP  Reason for consult:  headaches  Assessment/Plan:   Chronic migraine without aura, without status migrainosus, not intractable  Migraine prevention:  Start Ajovy every 28 days Migraine rescue:  She will try Nurtec.  If ineffective, will try sumatriptan '6mg'$  Eleanor.  Zofran ODT '4mg'$  for nausea Limit use of pain relievers to no more than 2 days out of week to prevent risk of rebound or medication-overuse headache. Keep headache diary Follow up 5 months.    Subjective:  Mariah Rocha is a 37 year old female with hypothyroidism, left sided hearing loss and hypertension who presents for headaches.  History supplemented by referring provider's note.  CT and MRI of brain personally reviewed.  Onset:  childhood Location:  mid-frontal and radiates to either/both side of front radiating to back of head and down neck into shoulders Quality:  pressure, throbbing Intensity:  10/10.   Aura:  absent Prodrome:  absent Associated symptoms:  nausea, photophobia, phonophobia, osmophobia, lightheadedness.  She denies associated vomiting, visual disturbance, unilateral numbness or weakness. Duration:  all day; sumatriptan NS - 1-2 hours with residual pain for rest of day and returns at bedtime Frequency:  every day to every other day Frequency of abortive medication: every other day Triggers:  certain smells (cologne, perfume) Relieving factors:  rest Activity:  affects work  05/08/2016 CT HEAD WO:  Normal study She has left sided hearing loss since at least 2006.  07/09/2004 MRI BRAIN & TEMPORAL BONES W WO:  Negative examination.  It is genetic.  Past NSAIDS/analgesics:  ibuprofen Past abortive triptans:  sumatriptan tab Past abortive ergotamine:  none Past muscle relaxants:  none Past anti-emetic:  Zofran Past antihypertensive  medications:  propranolol, amlodipine Past antidepressant medications:  duloxetine Past anticonvulsant medications:  topiramate Past anti-CGRP:  none Past vitamins/Herbal/Supplements:  none Past antihistamines/decongestants:  Benadryl Other past therapies:  none  Current NSAIDS/analgesics:  Tylenol Current triptans:  sumatriptan '20mg'$  NS (helps but doesn't like the postnasal drip - she would consider the sumatriptan injection instead). Current ergotamine:  none Current anti-emetic:  none Current muscle relaxants:  none Current Antihypertensive medications:  labetalol Current Antidepressant medications:  none Current Anticonvulsant medications:  none Current anti-CGRP:  Nurtec '75mg'$  PRN Current Vitamins/Herbal/Supplements:  none Current Antihistamines/Decongestants:  loratadine Other therapy:  none Birth control:  none Other medications:  levothyroxine, metformin   Caffeine:  1 cup coffee in morning.  Limits soda Diet:  Limits soda.  Drinks at least 32 oz water daily.  Does not skip meals. Exercise:  not routine.  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 Sleep hygiene:  okay.  Variable.  Full time in front office in school and works part-time 3 days a week at [employer] (closes up) - usually 5 to 6 hours of sleep a night. Family history of headache:  no      PAST MEDICAL HISTORY: Past Medical History:  Diagnosis Date   Allergic rhinitis    Lower back pain    Thyroid disease     PAST SURGICAL HISTORY: Past Surgical History:  Procedure Laterality Date   HERNIA REPAIR  1989    Dr.Kathleen Linton Rump     MEDICATIONS: Current Outpatient Medications on File Prior to Visit  Medication Sig Dispense Refill  labetalol (NORMODYNE) 100 MG tablet Take 1.5 tablets (150 mg total) by mouth 2 (two) times daily. 270 tablet 1   levothyroxine (SYNTHROID) 112 MCG  tablet Take 1 tablet (112 mcg total) by mouth daily. 90 tablet 3   loratadine (CLARITIN) 10 MG tablet Take 1 tablet (10 mg total) by mouth daily. 30 tablet 11   metFORMIN (GLUCOPHAGE-XR) 500 MG 24 hr tablet Take 500 mg by mouth daily.     Rimegepant Sulfate (NURTEC) 75 MG TBDP Take 1 tablet (75 mg total) by mouth daily as needed (migraine). 30 tablet 1   sodium chloride (OCEAN) 0.65 % nasal spray Place 1 spray into the nose as needed for congestion. 30 mL 12   No current facility-administered medications on file prior to visit.    ALLERGIES: No Known Allergies  FAMILY HISTORY: Family History  Problem Relation Age of Onset   Breast cancer Maternal Aunt    Hypertension Mother     Objective:  Blood pressure 130/85, pulse 89, height '5\' 5"'$  (1.651 m), weight 195 lb (88.5 kg), SpO2 98 %. General: No acute distress.  Patient appears well-groomed.   Head:  Normocephalic/atraumatic Eyes:  fundi examined but not visualized Neck: supple, no paraspinal tenderness, full range of motion Back: No paraspinal tenderness Heart: regular rate and rhythm Lungs: Clear to auscultation bilaterally. Vascular: No carotid bruits. Neurological Exam: Mental status: alert and oriented to person, place, and time, speech fluent and not dysarthric, language intact. Cranial nerves: CN I: not tested CN II: pupils equal, round and reactive to light, visual fields intact CN III, IV, VI:  full range of motion, no nystagmus, no ptosis CN V: facial sensation intact. CN VII: upper and lower face symmetric CN VIII: hearing reduced in left ear CN IX, X: gag intact, uvula midline CN XI: sternocleidomastoid and trapezius muscles intact CN XII: tongue midline Bulk & Tone: normal, no fasciculations. Motor:  muscle strength 5/5 throughout Sensation:  Pinprick, temperature and vibratory sensation intact. Deep Tendon Reflexes:  2+ throughout,  toes downgoing.   Finger to nose testing:  Without dysmetria.   Heel to shin:   Without dysmetria.   Gait:  Normal station and stride.  Romberg negative.    Thank you for allowing me to take part in the care of this patient.  Metta Clines, DO  CC: Sherrie Mustache, NP

## 2022-06-04 ENCOUNTER — Encounter: Payer: Self-pay | Admitting: Neurology

## 2022-06-04 ENCOUNTER — Ambulatory Visit: Payer: BC Managed Care – PPO | Admitting: Neurology

## 2022-06-04 VITALS — BP 130/85 | HR 89 | Ht 65.0 in | Wt 195.0 lb

## 2022-06-04 DIAGNOSIS — G43709 Chronic migraine without aura, not intractable, without status migrainosus: Secondary | ICD-10-CM

## 2022-06-04 MED ORDER — AJOVY 225 MG/1.5ML ~~LOC~~ SOAJ: 225.0000 mg | SUBCUTANEOUS | 11 refills | Status: DC

## 2022-06-04 MED ORDER — ONDANSETRON 4 MG PO TBDP: 4.0000 mg | ORAL_TABLET | Freq: Three times a day (TID) | ORAL | 5 refills | Status: DC | PRN

## 2022-06-04 NOTE — Patient Instructions (Addendum)
  Start Ajovy every 28 days.  Try Nurtec once daily as needed (take earliest onset of migraine).  If not effective, let me know and I will change the sumatriptan spray to injection Ondansetron/Zofran for nausea Limit use of pain relievers to no more than 2 days out of the week.  These medications include acetaminophen, NSAIDs (ibuprofen/Advil/Motrin, naproxen/Aleve, triptans (Imitrex/sumatriptan), Excedrin, and narcotics.  This will help reduce risk of rebound headaches. Be aware of common food triggers:  - Caffeine:  coffee, black tea, cola, Mt. Dew  - Chocolate  - Dairy:  aged cheeses (brie, blue, cheddar, gouda, [place], provolone, [place], Swiss, etc), chocolate milk, buttermilk, sour cream, limit eggs and yogurt  - Nuts, peanut butter  - Alcohol  - Cereals/grains:  FRESH breads (fresh bagels, sourdough, doughnuts), yeast productions  - Processed/canned/aged/cured meats (pre-packaged deli meats, hotdogs)  - MSG/glutamate:  soy sauce, flavor enhancer, pickled/preserved/marinated foods  - Sweeteners:  aspartame (Equal, Nutrasweet).  Sugar and Splenda are okay  - Vegetables:  legumes (lima beans, lentils, snow peas, fava beans, pinto peans, peas, garbanzo beans), sauerkraut, onions, olives, pickles  - Fruit:  avocados, bananas, citrus fruit (orange, lemon, grapefruit), mango  - Other:  Frozen meals, macaroni and cheese Routine exercise Stay adequately hydrated (aim for 64 oz water daily) Keep headache diary Maintain proper stress management Maintain proper sleep hygiene Do not skip meals Consider supplements:  magnesium citrate '400mg'$  daily, riboflavin '400mg'$  daily, coenzyme Q10 '300mg'$  daily.

## 2022-09-24 ENCOUNTER — Ambulatory Visit (INDEPENDENT_AMBULATORY_CARE_PROVIDER_SITE_OTHER): Payer: BC Managed Care – PPO | Admitting: Nurse Practitioner

## 2022-09-24 ENCOUNTER — Telehealth (INDEPENDENT_AMBULATORY_CARE_PROVIDER_SITE_OTHER): Payer: BC Managed Care – PPO | Admitting: Internal Medicine

## 2022-09-24 ENCOUNTER — Encounter: Payer: Self-pay | Admitting: Internal Medicine

## 2022-09-24 ENCOUNTER — Encounter: Payer: Self-pay | Admitting: Nurse Practitioner

## 2022-09-24 VITALS — BP 118/72 | HR 93 | Temp 97.5°F | Resp 18 | Ht 65.0 in | Wt 196.0 lb

## 2022-09-24 VITALS — Ht 65.0 in | Wt 196.0 lb

## 2022-09-24 DIAGNOSIS — R6883 Chills (without fever): Secondary | ICD-10-CM | POA: Diagnosis not present

## 2022-09-24 DIAGNOSIS — J101 Influenza due to other identified influenza virus with other respiratory manifestations: Secondary | ICD-10-CM

## 2022-09-24 DIAGNOSIS — R0981 Nasal congestion: Secondary | ICD-10-CM

## 2022-09-24 DIAGNOSIS — E039 Hypothyroidism, unspecified: Secondary | ICD-10-CM | POA: Diagnosis not present

## 2022-09-24 LAB — POCT INFLUENZA A/B
Influenza A, POC: POSITIVE — AB
Influenza B, POC: NEGATIVE

## 2022-09-24 LAB — POC COVID19 BINAXNOW: SARS Coronavirus 2 Ag: NEGATIVE

## 2022-09-24 MED ORDER — LEVOTHYROXINE SODIUM 112 MCG PO TABS: 112.0000 ug | ORAL_TABLET | Freq: Every day | ORAL | 3 refills | Status: DC

## 2022-09-24 NOTE — Patient Instructions (Addendum)
To take tylenol 1000 mg by mouth every 8 hours for aches/pains/sore throat.   To use aleve 1 tablet twice daily to help with inflammation  Plain nasal saline spray throughout the day as needed  Mucinex DM by mouth twice daily as needed for cough and chest congestion with full glass of water  Keep well hydrated Proper nutrition  Avoid forcefully blowing nose Vit c 1000 mg twice daily  Vit d 2000 units daily

## 2022-09-24 NOTE — Addendum Note (Signed)
Addended by: Jonni Sanger on: 09/24/2022 01:14 PM   Modules accepted: Orders

## 2022-09-24 NOTE — Progress Notes (Signed)
Careteam: Patient Care Team: Sharon Seller, NP as PCP - General (Geriatric Medicine) Drema Dallas, DO as Consulting Physician (Neurology)  PLACE OF SERVICE:  [facility]  Advanced Directive information    No Known Allergies  Chief Complaint  Patient presents with   Acute Visit    Patient is here for cough, headaches,sore throat, and body aches since Saturday     HPI: Patient is a 37 y.o. female due to sore throat.  Reports all of a sudden 5 days ago she started having body aches with a sore throat.  Never had a fever. A few days later started with a cough.  Sore throat is still very painful 10/10 No sick contacts Productive cough.  Having nasal congestion.  Some nasal drainage.   Review of Systems:  Review of Systems  Constitutional:  Positive for chills and malaise/fatigue. Negative for fever and weight loss.  HENT:  Negative for tinnitus.   Respiratory:  Positive for cough and sputum production. Negative for shortness of breath.   Cardiovascular:  Negative for chest pain, palpitations and leg swelling.  Gastrointestinal:  Negative for abdominal pain, constipation, diarrhea and heartburn.  Genitourinary:  Negative for dysuria, frequency and urgency.  Musculoskeletal:  Negative for back pain, falls, joint pain and myalgias.  Skin: Negative.   Neurological:  Positive for headaches. Negative for dizziness.  Psychiatric/Behavioral:  Negative for depression and memory loss. The patient does not have insomnia.     Past Medical History:  Diagnosis Date   Allergic rhinitis    Hx of migraines    Lower back pain    Thyroid disease    Past Surgical History:  Procedure Laterality Date   HERNIA REPAIR  1989    Dr.Kathleen Samuel Bouche    Social History:   reports that she quit smoking about 2 years ago. Her smoking use included cigarettes. She has never used smokeless tobacco. She reports current alcohol use. She reports current drug use. Drug: Marijuana.  Family  History  Problem Relation Age of Onset   Hypertension Mother    Multiple sclerosis Maternal Aunt    Breast cancer Maternal Aunt    Dementia Maternal Grandmother    Parkinsonism Maternal Grandfather     Medications: Patient's Medications  New Prescriptions   No medications on file  Previous Medications   FREMANEZUMAB-VFRM (AJOVY) 225 MG/1.5ML SOAJ    Inject 225 mg into the skin every 28 (twenty-eight) days.   LABETALOL (NORMODYNE) 100 MG TABLET    Take 1.5 tablets (150 mg total) by mouth 2 (two) times daily.   LEVOTHYROXINE (SYNTHROID) 112 MCG TABLET    Take 1 tablet (112 mcg total) by mouth daily.   LORATADINE (CLARITIN) 10 MG TABLET    Take 1 tablet (10 mg total) by mouth daily.   METFORMIN (GLUCOPHAGE-XR) 500 MG 24 HR TABLET    Take 500 mg by mouth daily.   ONDANSETRON (ZOFRAN-ODT) 4 MG DISINTEGRATING TABLET    Take 1 tablet (4 mg total) by mouth every 8 (eight) hours as needed for nausea or vomiting.   RIMEGEPANT SULFATE (NURTEC) 75 MG TBDP    Take 1 tablet (75 mg total) by mouth daily as needed (migraine).   SODIUM CHLORIDE (OCEAN) 0.65 % NASAL SPRAY    Place 1 spray into the nose as needed for congestion.  Modified Medications   No medications on file  Discontinued Medications   No medications on file    Physical Exam:  Vitals:   09/24/22  1010  BP: 118/72  Pulse: 93  Resp: 18  Temp: (!) 97.5 F (36.4 C)  SpO2: 97%  Weight: 196 lb (88.9 kg)  Height: 5\' 5"  (1.651 m)   Body mass index is 32.62 kg/m. Wt Readings from Last 3 Encounters:  09/24/22 196 lb (88.9 kg)  06/04/22 195 lb (88.5 kg)  05/30/22 194 lb (88 kg)    Physical Exam Constitutional:      General: She is not in acute distress.    Appearance: She is well-developed. She is not diaphoretic.  HENT:     Head: Normocephalic and atraumatic.     Right Ear: Tympanic membrane, ear canal and external ear normal.     Left Ear: Tympanic membrane, ear canal and external ear normal.     Nose: Congestion and  rhinorrhea present.     Mouth/Throat:     Pharynx: No oropharyngeal exudate.  Eyes:     Conjunctiva/sclera: Conjunctivae normal.     Pupils: Pupils are equal, round, and reactive to light.  Cardiovascular:     Rate and Rhythm: Normal rate and regular rhythm.     Heart sounds: Normal heart sounds.  Pulmonary:     Effort: Pulmonary effort is normal.     Breath sounds: Normal breath sounds.  Abdominal:     General: Bowel sounds are normal.     Palpations: Abdomen is soft.  Musculoskeletal:     Cervical back: Normal range of motion and neck supple.     Right lower leg: No edema.     Left lower leg: No edema.  Skin:    General: Skin is warm and dry.  Neurological:     Mental Status: She is alert and oriented to person, place, and time. Mental status is at baseline.  Psychiatric:        Mood and Affect: Mood normal.     Labs reviewed: Basic Metabolic Panel: Recent Labs    03/14/22 0910 05/30/22 1602  NA  --  139  K  --  4.4  CL  --  104  CO2  --  27  GLUCOSE  --  83  BUN  --  11  CREATININE  --  0.81  CALCIUM  --  9.5  TSH 4.82  --    Liver Function Tests: Recent Labs    05/30/22 1602  AST 22  ALT 18  BILITOT 0.5  PROT 7.3   No results for input(s): "LIPASE", "AMYLASE" in the last 8760 hours. No results for input(s): "AMMONIA" in the last 8760 hours. CBC: Recent Labs    05/30/22 1602  WBC 5.3  NEUTROABS 2,454  HGB 12.0  HCT 34.5*  MCV 89.6  PLT 306   Lipid Panel: Recent Labs    05/30/22 1602  CHOL 183  HDL 70  LDLCALC 90  TRIG 132  CHOLHDL 2.6   TSH: Recent Labs    03/14/22 0910  TSH 4.82   A1C: No results found for: "HGBA1C"   Assessment/Plan 1. Influenza A Day 5 of symptoms Supportive care recommended To take tylenol 1000 mg by mouth every 8 hours for aches/pains/sore throat.  To use aleve 1 tablet twice daily to help with inflammation  Plain nasal saline spray throughout the day as needed Mucinex DM by mouth twice daily as  needed for cough and chest congestion with full glass of water  Keep well hydrated Proper nutrition  Avoid forcefully blowing nose Vit c 1000 mg twice daily  Vit d 2000 units  daily   Mariah Rocha. Biagio Borg [facility] [phone]

## 2022-09-24 NOTE — Progress Notes (Signed)
Virtual Visit via Video Note  I connected with Mariah Rocha on 09/24/22 at 1400 by a video enabled telemedicine application and verified that I am speaking with the correct person using two identifiers.   I discussed the limitations of evaluation and management by telemedicine and the availability of in person appointments. The patient expressed understanding and agreed to proceed.   -Location of the patient : home -Location of the provider : Office -The names of all persons participating in the telemedicine service : Pt and myself       Name: Mariah Rocha  MRN/ DOB: [id number], 02-08-1986    Age/ Sex: 37 y.o., female     PCP: Sharon Seller, NP   Reason for Endocrinology Evaluation: Hypothyroidism     Initial Endocrinology Clinic Visit: 06/04/2018    PATIENT IDENTIFIER: Mariah Rocha is a 37 y.o., female with a past medical history of Graves' disease. She has followed with [facility] since 06/04/2018 for consultative assistance with management of her hypothyroidism.   HISTORICAL SUMMARY: The patient was first diagnosed with hyperthyroidism secondary to Graves' disease in 2015.  She was started on Methimazole that she took for 2 years but stopped due to lack of insurance. She presented again to her PCP in 03/2018 for evaluation of headaches, during work up she was foun to have elevated TSH at 6.29 uIU/mL.  She was started on levothyroxine 25 MCG daily by her PCP.   Paternal aunt with thyroid disease.  SUBJECTIVE:     Today (09/24/2022):  Mariah Rocha is here for a follow up on hypothyroidism.  She continues to take levothyroxine on daily basis.   She denies any local neck symptoms except for throat irritation as she was diagnosed today with influenza Any constipation or diarrhea Denies palpitation Denies tremors    No prior pregnancies , she is currently following up with the infertility clinic, she is on metformin and tamoxifen? Her  menstruations have become irregular  Levothyroxine 112 mcg daily    HISTORY:  Past Medical History:  Past Medical History:  Diagnosis Date   Allergic rhinitis    Hx of migraines    Lower back pain    Thyroid disease    Past Surgical History:  Past Surgical History:  Procedure Laterality Date   HERNIA REPAIR  1989    Dr.Kathleen Samuel Bouche    Social History:  reports that she quit smoking about 2 years ago. Her smoking use included cigarettes. She has never used smokeless tobacco. She reports current alcohol use. She reports current drug use. Drug: Marijuana. Family History:  Family History  Problem Relation Age of Onset   Hypertension Mother    Multiple sclerosis Maternal Aunt    Breast cancer Maternal Aunt    Dementia Maternal Grandmother    Parkinsonism Maternal Grandfather      HOME MEDICATIONS: Allergies as of 09/24/2022   No Known Allergies      Medication List        Accurate as of September 24, 2022  2:04 PM. If you have any questions, ask your nurse or doctor.          Ajovy 225 MG/1.5ML Soaj Generic drug: Fremanezumab-vfrm Inject 225 mg into the skin every 28 (twenty-eight) days.   labetalol 100 MG tablet Commonly known as: NORMODYNE Take 1.5 tablets (150 mg total) by mouth 2 (two) times daily.   levothyroxine 112 MCG tablet Commonly known as: SYNTHROID Take 1 tablet (112 mcg total) by mouth daily.  loratadine 10 MG tablet Commonly known as: CLARITIN Take 1 tablet (10 mg total) by mouth daily.   metFORMIN 500 MG 24 hr tablet Commonly known as: GLUCOPHAGE-XR Take 500 mg by mouth daily.   Nurtec 75 MG Tbdp Generic drug: Rimegepant Sulfate Take 1 tablet (75 mg total) by mouth daily as needed (migraine).   ondansetron 4 MG disintegrating tablet Commonly known as: ZOFRAN-ODT Take 1 tablet (4 mg total) by mouth every 8 (eight) hours as needed for nausea or vomiting.   sodium chloride 0.65 % nasal spray Commonly known as: OCEAN Place 1 spray into  the nose as needed for congestion.       PHYSICAL EXAM: VS: Ht 5\' 5"  (1.651 m)   Wt 196 lb (88.9 kg)   BMI 32.62 kg/m    EXAM: General: Pt appears well and is in NAD  Eyes: External eye exam normal without stare, lid lag or exophthalmos.  EOM intact.    Neck: General: Supple without adenopathy. Thyroid: Thyroid gland normal.  Lungs: Clear with good BS bilat   Heart: Auscultation: RRR   Abdomen: Normoactive bowel sounds, soft, nontender  Extremities: BL LE: no pretibial edema   Mental Status: Judgment, insight: intact Orientation: oriented to time, place, and person Mood and affect: no depression, anxiety, or agitation     DATA REVIEWED:   Latest Reference Range & Units 03/14/22 09:10  TSH 0.35 - 5.50 uIU/mL 4.82     Latest Reference Range & Units 03/14/22 09:10  Prolactin ng/mL 6.6    Thyroid Ultrasound 03/01/2019  Heterogeneous thyroid indicating medical thyroid disease.  ASSESSMENT / PLAN / RECOMMENDATIONS:   Hypothyroidism:   Clinically euthyroid  No local neck symptoms  Preconception counseling performed, patient to increase LT-4 replacement by 20% with positive pregnancy test.  We discussed increased LT-4 requirements during pregnancy Patient will stop by for TSH check  Medications   Continue levothyroxine 112 mcg daily       F/u in 1 yr   Signed electronically by: Lyndle Herrlich, MD  [facility]  [facility] [address] [place] [place], [place] [address] Phone: [phone] FAX: [phone]   CC: Sharon Seller, NP [address] [place] [address] Phone: [phone] Fax: [phone]   Return to Endocrinology clinic as below: Future Appointments  Date Time Provider Department Center  11/13/2022 11:30 AM Drema Dallas, DO [facility] None  12/05/2022  1:40 PM Sharon Seller, NP [facility] None

## 2022-09-29 ENCOUNTER — Ambulatory Visit: Payer: BC Managed Care – PPO | Admitting: Neurology

## 2022-10-02 ENCOUNTER — Ambulatory Visit
Admission: EM | Admit: 2022-10-02 | Discharge: 2022-10-02 | Disposition: A | Discharge status: Home / Self Care | Payer: BC Managed Care – PPO | Attending: Emergency Medicine | Admitting: Emergency Medicine

## 2022-10-02 DIAGNOSIS — H1032 Unspecified acute conjunctivitis, left eye: Secondary | ICD-10-CM | POA: Diagnosis not present

## 2022-10-02 MED ORDER — POLYMYXIN B-TRIMETHOPRIM 10000-0.1 UNIT/ML-% OP SOLN: 1.0000 [drp] | Freq: Four times a day (QID) | OPHTHALMIC | 0 refills | Status: AC

## 2022-10-02 NOTE — ED Provider Notes (Signed)
Renaldo Fiddler    CSN: [id number] Arrival date & time: 10/02/22  2130      History   Chief Complaint Chief Complaint  Patient presents with   Eye Problem    HPI Mariah Rocha is a 37 y.o. female.  Patient presents with 1 day history of left eye redness, drainage, itching, matted lashes; worse this morning.  No eye trauma, eye pain, change in vision, fever, or other symptoms.  No treatment at home.  She was seen at PCPs office on 09/24/2022; positive for influenza A; treated symptomatically.  The history is provided by the patient and medical records.    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   Thyromegaly 02/22/2019   Hx of Graves' disease 07/30/2018   Acquired hypothyroidism 07/30/2018   H/O hyperthyroidism 06/10/2017   Hyperlipidemia 02/22/2014   Temporal headache 04/19/2013   Cough 04/19/2013   Other and unspecified hyperlipidemia 02/09/2013   Need for prophylactic vaccination with combined diphtheria-tetanus-pertussis (DTP) vaccine 02/09/2013   Headache 02/09/2013   Migraine headache 01/12/2013   Low back pain without sciatica 01/12/2013   Allergic rhinitis 01/12/2013   Other malaise and fatigue 01/12/2013   Excessive sweating, local 01/12/2013   Routine general medical examination at a health care facility 01/12/2013    Past Surgical History:  Procedure Laterality Date   HERNIA REPAIR  1989    Dr.Kathleen Samuel Bouche     OB History   No obstetric history on file.      Home Medications    Prior to Admission medications   Medication Sig Start Date End Date Taking? Authorizing Provider  trimethoprim-polymyxin b (POLYTRIM) ophthalmic solution Place 1 drop into both eyes 4 (four) times daily for 7 days. 10/02/22 10/09/22 Yes Mickie Bail, NP   Fremanezumab-vfrm (AJOVY) 225 MG/1.5ML SOAJ Inject 225 mg into the skin every 28 (twenty-eight) days. 06/04/22   Drema Dallas, DO  labetalol (NORMODYNE) 100 MG tablet Take 1.5 tablets (150 mg total) by mouth 2 (two) times daily. 02/03/22   Sharon Seller, NP  levothyroxine (SYNTHROID) 112 MCG tablet Take 1 tablet (112 mcg total) by mouth daily. 09/24/22   Shamleffer, Konrad Dolores, MD  loratadine (CLARITIN) 10 MG tablet Take 1 tablet (10 mg total) by mouth daily. 03/17/22   Ngetich, Dinah C, NP  metFORMIN (GLUCOPHAGE-XR) 500 MG 24 hr tablet Take 500 mg by mouth daily. 11/29/20   [provider]  ondansetron (ZOFRAN-ODT) 4 MG disintegrating tablet Take 1 tablet (4 mg total) by mouth every 8 (eight) hours as needed for nausea or vomiting. 06/04/22   Drema Dallas, DO  Rimegepant Sulfate (NURTEC) 75 MG TBDP Take 1 tablet (75 mg total) by mouth daily as needed (migraine). 05/30/22   Sharon Seller, NP  sodium chloride (OCEAN) 0.65 % nasal spray Place 1 spray into the nose as needed for congestion. 03/26/22   Ngetich, Donalee Citrin, NP    Family History Family History  Problem Relation Age of Onset   Hypertension Mother    Multiple sclerosis Maternal Aunt    Breast cancer Maternal Aunt    Dementia Maternal Grandmother    Parkinsonism Maternal Grandfather     Social History Social History   Tobacco Use   Smoking  status: Former    Years: 2    Types: Cigarettes    Quit date: 10/30/2019    Years since quitting: 2.9   Smokeless tobacco: Never  Vaping Use   Vaping Use: Never used  Substance Use Topics   Alcohol use: Yes    Alcohol/week: 0.0 standard drinks of alcohol    Comment: once a week   Drug use: Yes    Types: Marijuana    Comment: For migraines     Allergies   Patient has no known allergies.   Review of Systems Review of Systems  Constitutional:  Negative for chills and fever.  HENT:  Negative for ear pain and sore throat.   Eyes:  Positive for discharge, redness and  itching. Negative for pain and visual disturbance.  Respiratory:  Negative for cough and shortness of breath.      Physical Exam Triage Vital Signs ED Triage Vitals  Enc Vitals Group     BP 10/02/22 0903 (!) 144/82     Pulse Rate 10/02/22 0902 95     Resp 10/02/22 0902 18     Temp 10/02/22 0902 97.9 F (36.6 C)     Temp src --      SpO2 10/02/22 0902 99 %     Weight --      Height --      Head Circumference --      Peak Flow --      Pain Score 10/02/22 0900 10     Pain Loc --      Pain Edu? --      Excl. in GC? --    No data found.  Updated Vital Signs BP (!) 144/82   Pulse 95   Temp 97.9 F (36.6 C)   Resp 18   LMP 09/30/2022   SpO2 99%   Visual Acuity Right Eye Distance:   Left Eye Distance:   Bilateral Distance:    Right Eye Near:   Left Eye Near:    Bilateral Near:     Physical Exam Vitals and nursing note reviewed.  Constitutional:      General: She is not in acute distress.    Appearance: She is well-developed.  HENT:     Right Ear: Tympanic membrane normal.     Left Ear: Tympanic membrane normal.     Nose: Nose normal.     Mouth/Throat:     Mouth: Mucous membranes are moist.     Pharynx: Oropharynx is clear.  Eyes:     General: Lids are normal. Vision grossly intact.     Conjunctiva/sclera:     Left eye: Left conjunctiva is injected.     Pupils: Pupils are equal, round, and reactive to light.  Cardiovascular:     Rate and Rhythm: Normal rate and regular rhythm.     Heart sounds: Normal heart sounds.  Pulmonary:     Effort: Pulmonary effort is normal. No respiratory distress.     Breath sounds: Normal breath sounds.  Musculoskeletal:     Cervical back: Neck supple.  Skin:    General: Skin is warm and dry.  Neurological:     Mental Status: She is alert.  Psychiatric:        Mood and Affect: Mood normal.        Behavior: Behavior normal.      UC Treatments / Results  Labs (all labs ordered are listed, but only abnormal results are  displayed) Labs Reviewed - No data to  display  EKG   Radiology No results found.  Procedures Procedures (including critical care time)  Medications Ordered in UC Medications - No data to display  Initial Impression / Assessment and Plan / UC Course  I have reviewed the triage vital signs and the nursing notes.  Pertinent labs & imaging results that were available during my care of the patient were reviewed by me and considered in my medical decision making (see chart for details).    Left bacterial conjunctivitis.  Treating with Polytrim eyedrops.  Education provided on conjunctivitis.  Instructed patient to follow-up with PCP if symptoms are not improving.  ED precautions discussed.  Patient agrees to plan of care.   Final Clinical Impressions(s) / UC Diagnoses   Final diagnoses:  Acute bacterial conjunctivitis of left eye     Discharge Instructions      Use the antibiotic eyedrops as prescribed.    Follow-up with your primary care provider if your symptoms are not improving.    Go to the emergency department if you have acute eye pain, changes in your vision, or other concerning symptoms.        ED Prescriptions     Medication Sig Dispense Auth. Provider   trimethoprim-polymyxin b (POLYTRIM) ophthalmic solution Place 1 drop into both eyes 4 (four) times daily for 7 days. 10 mL Mickie Bail, NP      PDMP not reviewed this encounter.   Mickie Bail, NP 10/02/22 [phone]

## 2022-10-02 NOTE — Discharge Instructions (Addendum)
Use the antibiotic eyedrops as prescribed.    Follow-up with your primary care provider if your symptoms are not improving.    Go to the emergency department if you have acute eye pain, changes in your vision, or other concerning symptoms.    

## 2022-10-02 NOTE — ED Triage Notes (Signed)
Patient to Urgent Care with complaints of left sided eye redness/ irritation/ drainage. Woke up with her eye matted shut.   Symptoms started yesterday. Recent flu.

## 2022-10-10 LAB — HM PAP SMEAR

## 2022-10-21 ENCOUNTER — Other Ambulatory Visit: Payer: BC Managed Care – PPO

## 2022-10-21 DIAGNOSIS — E039 Hypothyroidism, unspecified: Secondary | ICD-10-CM | POA: Diagnosis not present

## 2022-10-21 LAB — TSH: TSH: 0.23 u[IU]/mL — ABNORMAL LOW (ref 0.35–5.50)

## 2022-11-12 NOTE — Progress Notes (Signed)
NEUROLOGY FOLLOW UP OFFICE NOTE  Mariah Rocha [id number]  Assessment/Plan:   Migraine without aura, without status migrainosus, not intractable   Migraine prevention:  Ajovy every 28 days  Migraine rescue:  Nurtec.  Zofran ODT 4mg  for nausea  Limit use of pain relievers to no more than 2 days out of week to prevent risk of rebound or medication-overuse headache. Keep headache diary Follow up 6 months.  Subjective:  Mariah Rocha is a 37 year old female with hypothyroidism, left sided hearing loss and hypertension who follows up for migraine.  UPDATE: Started Arnetha Massy.  Doing much better. Nurtec effective. Intensity:  10/10 Duration:  2-3 hours with Nurtec. Frequency:  2 to 3 migraines a month. Current NSAIDS/analgesics:  Tylenol Current triptans:  none Current ergotamine:  none Current anti-emetic:  Zofran 4mg  Current muscle relaxants:  none Current Antihypertensive medications:  labetalol Current Antidepressant medications:  none Current Anticonvulsant medications:  none Current anti-CGRP:  Ajovy, Nurtec 75mg  PRN  Current Vitamins/Herbal/Supplements:  none Current Antihistamines/Decongestants:  loratadine Other therapy:  none Birth control:  none Other medications:  levothyroxine, metformin     Caffeine:  1 cup coffee in morning.  Limits soda Diet:  Limits soda.  Increased water intake.  Does not skip meals. Exercise:  not routine.  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 Sleep hygiene:  okay.  Variable.  Full time in front office in school and works part-time 3 days a week at [employer] (closes up) - usually 5 to 6 hours of sleep a night.  HISTORY:  Onset:  childhood Location:  mid-frontal and radiates to either/both side of front radiating to back of head and down neck into shoulders Quality:  pressure, throbbing Intensity:  10/10.    Aura:  absent Prodrome:  absent Associated symptoms:  nausea, photophobia, phonophobia, osmophobia, lightheadedness.  She denies associated vomiting, visual disturbance, unilateral numbness or weakness. Duration:  all day; sumatriptan NS - 1-2 hours with residual pain for rest of day and returns at bedtime Frequency:  every day to every other day Frequency of abortive medication: every other day Triggers:  certain smells (cologne, perfume) Relieving factors:  rest Activity:  affects work   05/08/2016 CT HEAD WO:  Normal study She has left sided hearing loss since at least 2006.  07/09/2004 MRI BRAIN & TEMPORAL BONES W WO:  Negative examination.  It is genetic.   Past NSAIDS/analgesics:  ibuprofen Past abortive triptans:  sumatriptan tab/NS Past abortive ergotamine:  none Past muscle relaxants:  none Past anti-emetic:  Zofran Past antihypertensive medications:  propranolol, amlodipine Past antidepressant medications:  duloxetine Past anticonvulsant medications:  topiramate Past anti-CGRP:  none Past vitamins/Herbal/Supplements:  none Past antihistamines/decongestants:  Benadryl Other past therapies:  none    Family history of headache:  no  PAST MEDICAL HISTORY: Past Medical History:  Diagnosis Date   Allergic rhinitis    Hx of migraines    Lower back pain    Thyroid disease     MEDICATIONS: Current Outpatient Medications on File Prior to Visit  Medication Sig Dispense Refill   Fremanezumab-vfrm (AJOVY) 225 MG/1.5ML SOAJ Inject 225 mg into the skin every 28 (twenty-eight) days. 1.68 mL 11   labetalol (NORMODYNE) 100 MG tablet Take 1.5 tablets (150 mg total) by mouth 2 (two) times daily. 270 tablet 1   levothyroxine (SYNTHROID) 112 MCG tablet Take 1  tablet (112 mcg total) by mouth daily. 90 tablet 3   loratadine (CLARITIN) 10 MG tablet Take 1 tablet (10 mg total) by mouth daily. 30 tablet 11   metFORMIN (GLUCOPHAGE-XR) 500 MG 24 hr tablet Take 500 mg by mouth daily.      ondansetron (ZOFRAN-ODT) 4 MG disintegrating tablet Take 1 tablet (4 mg total) by mouth every 8 (eight) hours as needed for nausea or vomiting. 20 tablet 5   Rimegepant Sulfate (NURTEC) 75 MG TBDP Take 1 tablet (75 mg total) by mouth daily as needed (migraine). 30 tablet 1   sodium chloride (OCEAN) 0.65 % nasal spray Place 1 spray into the nose as needed for congestion. 30 mL 12   No current facility-administered medications on file prior to visit.    ALLERGIES: No Known Allergies  FAMILY HISTORY: Family History  Problem Relation Age of Onset   Hypertension Mother    Multiple sclerosis Maternal Aunt    Breast cancer Maternal Aunt    Dementia Maternal Grandmother    Parkinsonism Maternal Grandfather       Objective:  Blood pressure (!) 147/97, pulse 96, height 5\' 5"  (1.651 m), weight 197 lb (89.4 kg), SpO2 99%. General: No acute distress.  Patient appears well-groomed.      Shon Millet, DO  CC: Abbey Chatters, NP

## 2022-11-13 ENCOUNTER — Encounter: Payer: Self-pay | Admitting: Neurology

## 2022-11-13 ENCOUNTER — Ambulatory Visit: Payer: BC Managed Care – PPO | Admitting: Neurology

## 2022-11-13 DIAGNOSIS — R519 Headache, unspecified: Secondary | ICD-10-CM | POA: Diagnosis not present

## 2022-11-13 MED ORDER — NURTEC 75 MG PO TBDP: 75.0000 mg | ORAL_TABLET | Freq: Every day | ORAL | 3 refills | Status: DC | PRN

## 2022-11-13 MED ORDER — ONDANSETRON 4 MG PO TBDP: 4.0000 mg | ORAL_TABLET | Freq: Three times a day (TID) | ORAL | 5 refills | Status: DC | PRN

## 2022-11-21 ENCOUNTER — Ambulatory Visit: Payer: BC Managed Care – PPO | Admitting: Nurse Practitioner

## 2022-11-21 ENCOUNTER — Encounter: Payer: Self-pay | Admitting: Nurse Practitioner

## 2022-11-21 VITALS — BP 138/86 | HR 88 | Temp 97.7°F | Resp 18 | Ht 65.0 in | Wt 198.0 lb

## 2022-11-21 DIAGNOSIS — U071 COVID-19: Secondary | ICD-10-CM | POA: Diagnosis not present

## 2022-11-21 DIAGNOSIS — J029 Acute pharyngitis, unspecified: Secondary | ICD-10-CM | POA: Diagnosis not present

## 2022-11-21 DIAGNOSIS — R059 Cough, unspecified: Secondary | ICD-10-CM | POA: Diagnosis not present

## 2022-11-21 DIAGNOSIS — R52 Pain, unspecified: Secondary | ICD-10-CM

## 2022-11-21 LAB — POC COVID19 BINAXNOW: SARS Coronavirus 2 Ag: POSITIVE — AB

## 2022-11-21 LAB — POCT RAPID STREP A (OFFICE): Rapid Strep A Screen: NEGATIVE

## 2022-11-21 NOTE — Patient Instructions (Addendum)
Netipot or saline wash daily Plain nasal saline spray throughout the day as needed May use tylenol 500 mg 2 tablets every 8 hours as needed aches and pains or sore throat Ibuprofen 200 mg, 2 tablets every 4 hours as needed pain/body aches  - take with food.   humidifier in the home to help with the dry air Mucinex DM by mouth twice daily as needed for cough and chest congestion with full glass of water - take routinely for the next 5 days then as needed  Keep well hydrated Proper nutrition  Avoid forcefully blowing nose Vit c 1000 mg twice daily Vit d 2000 units daily  Take Claritin 10 mg daily routinely for next week  Can use zinc 50 mg daily - take with food.   Your test for COVID-19 was positive, meaning that you were infected with the coronavirus and could give the germ to others.  Please continue isolation at home for at least 5 days since the start of your symptoms. On day 5, as long as your symptoms are improving and you are not having a fever, you can return to normal activities, ensuring you are wearing a mask at all times when not at home.     Once you complete your 10 days you are good to resume normal acitivities.

## 2022-11-21 NOTE — Progress Notes (Unsigned)
Careteam: Patient Care Team: Sharon Seller, NP as PCP - General (Geriatric Medicine) Drema Dallas, DO as Consulting Physician (Neurology)  PLACE OF SERVICE:  [facility]  Advanced Directive information Does Patient Have a Medical Advance Directive?: No  No Known Allergies  Chief Complaint  Patient presents with   Acute Visit    Been having  body pain , aches , sore throat, patient has taken tea and theraflu    Immunizations    Due for Covid , Flu      HPI: Patient is a 37 y.o. female for body aches sore throat, head ache and not feeling well for 2 days.  No fever.  Cough and congestion.  Sore throat- taking theraflu  Positive COVID test.  No shortness of breath or chest pains.  No n/v/diarrhea.  Review of Systems:  Review of Systems  Constitutional:  Positive for chills and malaise/fatigue. Negative for fever and weight loss.  HENT:  Positive for congestion and sore throat. Negative for tinnitus.   Respiratory:  Positive for cough. Negative for sputum production and shortness of breath.   Cardiovascular:  Negative for chest pain, palpitations and leg swelling.  Gastrointestinal:  Negative for abdominal pain, constipation, diarrhea and heartburn.  Genitourinary:  Negative for dysuria, frequency and urgency.  Musculoskeletal:  Negative for back pain, falls, joint pain and myalgias.  Skin: Negative.   Neurological:  Negative for dizziness and headaches.  Psychiatric/Behavioral:  Negative for depression and memory loss. The patient does not have insomnia.     Past Medical History:  Diagnosis Date   Allergic rhinitis    Hx of migraines    Lower back pain    Thyroid disease    Past Surgical History:  Procedure Laterality Date   HERNIA REPAIR  1989    Dr.Kathleen Samuel Bouche    Social History:   reports that she quit smoking about 3 years ago. Her smoking use included cigarettes. She started smoking about 5 years ago. She has never used smokeless tobacco. She  reports current alcohol use. She reports current drug use. Drug: Marijuana.  Family History  Problem Relation Age of Onset   Hypertension Mother    Multiple sclerosis Maternal Aunt    Breast cancer Maternal Aunt    Dementia Maternal Grandmother    Parkinsonism Maternal Grandfather     Medications: Patient's Medications  New Prescriptions   No medications on file  Previous Medications   FREMANEZUMAB-VFRM (AJOVY) 225 MG/1.5ML SOAJ    Inject 225 mg into the skin every 28 (twenty-eight) days.   LABETALOL (NORMODYNE) 100 MG TABLET    Take 1.5 tablets (150 mg total) by mouth 2 (two) times daily.   LEVOTHYROXINE (SYNTHROID) 112 MCG TABLET    Take 1 tablet (112 mcg total) by mouth daily.   LORATADINE (CLARITIN) 10 MG TABLET    Take 1 tablet (10 mg total) by mouth daily.   METFORMIN (GLUCOPHAGE-XR) 500 MG 24 HR TABLET    Take 500 mg by mouth daily.   ONDANSETRON (ZOFRAN-ODT) 4 MG DISINTEGRATING TABLET    Take 1 tablet (4 mg total) by mouth every 8 (eight) hours as needed for nausea or vomiting.   RIMEGEPANT SULFATE (NURTEC) 75 MG TBDP    Take 1 tablet (75 mg total) by mouth daily as needed (migraine).   SODIUM CHLORIDE (OCEAN) 0.65 % NASAL SPRAY    Place 1 spray into the nose as needed for congestion.  Modified Medications   No medications on file  Discontinued Medications   No medications on file    Physical Exam:  Vitals:   11/21/22 0931  BP: 138/86  Pulse: 88  Resp: 18  Temp: 97.7 F (36.5 C)  TempSrc: Temporal  SpO2: 94%  Weight: 198 lb (89.8 kg)  Height: 5\' 5"  (1.651 m)   Body mass index is 32.95 kg/m. Wt Readings from Last 3 Encounters:  11/21/22 198 lb (89.8 kg)  11/13/22 197 lb (89.4 kg)  09/24/22 196 lb (88.9 kg)    Physical Exam Constitutional:      General: She is not in acute distress.    Appearance: She is well-developed. She is not diaphoretic.  HENT:     Head: Normocephalic and atraumatic.     Mouth/Throat:     Pharynx: No oropharyngeal exudate.   Eyes:     Conjunctiva/sclera: Conjunctivae normal.     Pupils: Pupils are equal, round, and reactive to light.  Cardiovascular:     Rate and Rhythm: Normal rate and regular rhythm.     Heart sounds: Normal heart sounds.  Pulmonary:     Effort: Pulmonary effort is normal.     Breath sounds: Normal breath sounds.  Abdominal:     General: Bowel sounds are normal.     Palpations: Abdomen is soft.  Musculoskeletal:     Cervical back: Normal range of motion and neck supple.     Right lower leg: No edema.     Left lower leg: No edema.  Skin:    General: Skin is warm and dry.  Neurological:     Mental Status: She is alert.  Psychiatric:        Mood and Affect: Mood normal.   ***  Labs reviewed: Basic Metabolic Panel: Recent Labs    03/14/22 0910 05/30/22 1602 10/21/22 1139  NA  --  139  --   K  --  4.4  --   CL  --  104  --   CO2  --  27  --   GLUCOSE  --  83  --   BUN  --  11  --   CREATININE  --  0.81  --   CALCIUM  --  9.5  --   TSH 4.82  --  0.23*   Liver Function Tests: Recent Labs    05/30/22 1602  AST 22  ALT 18  BILITOT 0.5  PROT 7.3   No results for input(s): "LIPASE", "AMYLASE" in the last 8760 hours. No results for input(s): "AMMONIA" in the last 8760 hours. CBC: Recent Labs    05/30/22 1602  WBC 5.3  NEUTROABS 2,454  HGB 12.0  HCT 34.5*  MCV 89.6  PLT 306   Lipid Panel: Recent Labs    05/30/22 1602  CHOL 183  HDL 70  LDLCALC 90  TRIG 132  CHOLHDL 2.6   TSH: Recent Labs    03/14/22 0910 10/21/22 1139  TSH 4.82 0.23*   A1C: No results found for: "HGBA1C"   Assessment/Plan 1. Sore throat - POC COVID-19 positive - POC Rapid Strep A neg  2. Cough, unspecified type - POC COVID-19 positive   3. Body aches - POC COVID-19  4. COVID-19 ***  No follow-ups on file.: ***  Quamaine Webb K. Biagio Borg [facility] & Adult Medicine [phone] .

## 2022-12-05 ENCOUNTER — Encounter: Payer: Self-pay | Admitting: Nurse Practitioner

## 2022-12-05 ENCOUNTER — Ambulatory Visit (INDEPENDENT_AMBULATORY_CARE_PROVIDER_SITE_OTHER): Payer: BC Managed Care – PPO | Admitting: Nurse Practitioner

## 2022-12-05 VITALS — BP 122/80 | HR 84 | Temp 97.5°F | Ht 65.0 in | Wt 199.8 lb

## 2022-12-05 DIAGNOSIS — R519 Headache, unspecified: Secondary | ICD-10-CM | POA: Diagnosis not present

## 2022-12-05 DIAGNOSIS — I10 Essential (primary) hypertension: Secondary | ICD-10-CM

## 2022-12-05 DIAGNOSIS — E039 Hypothyroidism, unspecified: Secondary | ICD-10-CM | POA: Diagnosis not present

## 2022-12-05 MED ORDER — LABETALOL HCL 100 MG PO TABS
150.0000 mg | ORAL_TABLET | Freq: Two times a day (BID) | ORAL | 1 refills | Status: DC
Start: 2022-12-05 — End: 2023-12-07

## 2022-12-05 NOTE — Progress Notes (Signed)
Careteam: Patient Care Team: Mariah Seller, NP as PCP - General (Geriatric Medicine) Mariah Dallas, DO as Consulting Physician (Neurology)  PLACE OF SERVICE:  [facility] CLIN[facility]  Advanced Directive information    No Known Allergies  Chief Complaint  Patient presents with   Medical Management of Chronic Issues    6 month follow-up. Discuss need for flu vaccine and covid booster.      HPI: Patient is a 37 y.o. female for routine follow up.   She was really sick with COVID for 1 week.   She is seeing endocrinology for thyroid. Dose adjusted in July, following back in 6 months.   Seeing GYN, trying to get pregnant. Her cycle has been irregular.  Having hot flashes at time.   Blood pressure controlled on labetalol    Review of Systems:  Review of Systems  Constitutional:  Negative for chills, fever and weight loss.  HENT:  Negative for tinnitus.   Respiratory:  Negative for cough, sputum production and shortness of breath.   Cardiovascular:  Negative for chest pain, palpitations and leg swelling.  Gastrointestinal:  Negative for abdominal pain, constipation, diarrhea and heartburn.  Genitourinary:  Negative for dysuria, frequency and urgency.  Musculoskeletal:  Negative for back pain, falls, joint pain and myalgias.  Skin: Negative.   Neurological:  Negative for dizziness and headaches.  Psychiatric/Behavioral:  Negative for depression and memory loss. The patient does not have insomnia.     Past Medical History:  Diagnosis Date   Allergic rhinitis    Hx of migraines    Lower back pain    Thyroid disease    Past Surgical History:  Procedure Laterality Date   HERNIA REPAIR  1989    Dr.Kathleen Samuel Bouche    Social History:   reports that she quit smoking about 3 years ago. Her smoking use included cigarettes. She started smoking about 5 years ago. She has never used smokeless tobacco. She reports current alcohol use. She reports current drug use. Drug:  Marijuana.  Family History  Problem Relation Age of Onset   Hypertension Mother    Multiple sclerosis Maternal Aunt    Breast cancer Maternal Aunt    Dementia Maternal Grandmother    Parkinsonism Maternal Grandfather     Medications: Patient's Medications  New Prescriptions   No medications on file  Previous Medications   FREMANEZUMAB-VFRM (AJOVY) 225 MG/1.5ML SOAJ    Inject 225 mg into the skin every 28 (twenty-eight) days.   LABETALOL (NORMODYNE) 100 MG TABLET    Take 1.5 tablets (150 mg total) by mouth 2 (two) times daily.   LEVOTHYROXINE (SYNTHROID) 112 MCG TABLET    Take 1 tablet (112 mcg total) by mouth daily.   LORATADINE (CLARITIN) 10 MG TABLET    Take 1 tablet (10 mg total) by mouth daily.   METFORMIN (GLUCOPHAGE-XR) 500 MG 24 HR TABLET    Take 500 mg by mouth daily.   ONDANSETRON (ZOFRAN-ODT) 4 MG DISINTEGRATING TABLET    Take 1 tablet (4 mg total) by mouth every 8 (eight) hours as needed for nausea or vomiting.   RIMEGEPANT SULFATE (NURTEC) 75 MG TBDP    Take 1 tablet (75 mg total) by mouth daily as needed (migraine).   SODIUM CHLORIDE (OCEAN) 0.65 % NASAL SPRAY    Place 1 spray into the nose as needed for congestion.  Modified Medications   No medications on file  Discontinued Medications   No medications on file    Physical  Exam:  Vitals:   12/05/22 1133  BP: 122/80  Pulse: 84  Temp: (!) 97.5 F (36.4 C)  TempSrc: Temporal  SpO2: 99%  Weight: 199 lb 12.8 oz (90.6 kg)  Height: 5\' 5"  (1.651 m)   Body mass index is 33.25 kg/m. Wt Readings from Last 3 Encounters:  12/05/22 199 lb 12.8 oz (90.6 kg)  11/21/22 198 lb (89.8 kg)  11/13/22 197 lb (89.4 kg)    Physical Exam Constitutional:      General: She is not in acute distress.    Appearance: She is well-developed. She is not diaphoretic.  HENT:     Head: Normocephalic and atraumatic.     Mouth/Throat:     Pharynx: No oropharyngeal exudate.  Eyes:     Conjunctiva/sclera: Conjunctivae normal.      Pupils: Pupils are equal, round, and reactive to light.  Cardiovascular:     Rate and Rhythm: Normal rate and regular rhythm.     Heart sounds: Normal heart sounds.  Pulmonary:     Effort: Pulmonary effort is normal.     Breath sounds: Normal breath sounds.  Abdominal:     General: Bowel sounds are normal.     Palpations: Abdomen is soft.  Musculoskeletal:     Cervical back: Normal range of motion and neck supple.     Right lower leg: No edema.     Left lower leg: No edema.  Skin:    General: Skin is warm and dry.  Neurological:     Mental Status: She is alert.  Psychiatric:        Mood and Affect: Mood normal.     Labs reviewed: Basic Metabolic Panel: Recent Labs    03/14/22 0910 05/30/22 1602 10/21/22 1139  NA  --  139  --   K  --  4.4  --   CL  --  104  --   CO2  --  27  --   GLUCOSE  --  83  --   BUN  --  11  --   CREATININE  --  0.81  --   CALCIUM  --  9.5  --   TSH 4.82  --  0.23*   Liver Function Tests: Recent Labs    05/30/22 1602  AST 22  ALT 18  BILITOT 0.5  PROT 7.3   No results for input(s): "LIPASE", "AMYLASE" in the last 8760 hours. No results for input(s): "AMMONIA" in the last 8760 hours. CBC: Recent Labs    05/30/22 1602  WBC 5.3  NEUTROABS 2,454  HGB 12.0  HCT 34.5*  MCV 89.6  PLT 306   Lipid Panel: Recent Labs    05/30/22 1602  CHOL 183  HDL 70  LDLCALC 90  TRIG 132  CHOLHDL 2.6   TSH: Recent Labs    03/14/22 0910 10/21/22 1139  TSH 4.82 0.23*   A1C: No results found for: "HGBA1C"   Assessment/Plan 1. Essential hypertension Well controlled on current regimen.  - labetalol (NORMODYNE) 100 MG tablet; Take 1.5 tablets (150 mg total) by mouth 2 (two) times daily.  Dispense: 270 tablet; Refill: 1  2. Nonintractable headache, unspecified chronicity pattern, unspecified headache type -stable on current regimen, continues to follow up with neurology  3. Acquired hypothyroidism Recent dose adjustment per  endocrinology  Continues synthroid    Return in about 1 year (around 12/05/2023) for CPE . Mariah Rocha. Biagio Borg [facility] Adult Medicine [phone]

## 2023-02-10 ENCOUNTER — Ambulatory Visit: Payer: BC Managed Care – PPO | Admitting: Family

## 2023-02-10 ENCOUNTER — Encounter: Payer: Self-pay | Admitting: Family

## 2023-02-10 VITALS — BP 128/88 | HR 80 | Temp 98.3°F | Ht 65.0 in

## 2023-02-10 DIAGNOSIS — J029 Acute pharyngitis, unspecified: Secondary | ICD-10-CM

## 2023-02-10 DIAGNOSIS — J0100 Acute maxillary sinusitis, unspecified: Secondary | ICD-10-CM | POA: Diagnosis not present

## 2023-02-10 DIAGNOSIS — R52 Pain, unspecified: Secondary | ICD-10-CM | POA: Diagnosis not present

## 2023-02-10 LAB — POC COVID19 BINAXNOW: SARS Coronavirus 2 Ag: NEGATIVE

## 2023-02-10 LAB — POCT INFLUENZA A/B
Influenza A, POC: NEGATIVE
Influenza B, POC: NEGATIVE

## 2023-02-10 MED ORDER — AMOXICILLIN-POT CLAVULANATE 875-125 MG PO TABS: 1.0000 | ORAL_TABLET | Freq: Two times a day (BID) | ORAL | 0 refills | Status: AC

## 2023-02-10 NOTE — Patient Instructions (Addendum)
-   Take loratadine 10 mg tablet one by mouth daily for runny nose  - vitamin C 250 mg table t one by daily  - Tylenol 500 mg tablet one by every 8 hrs as needed  - Vitamin D 1000 units one by mouth daily  - Zinc 50 mg tablet one by mouth daily x 14 days

## 2023-02-10 NOTE — Progress Notes (Signed)
Provider: Emmely Bittinger FNP-C  Sharon Seller, NP  Patient Care Team: Sharon Seller, NP as PCP - General (Geriatric Medicine) Drema Dallas, DO as Consulting Physician (Neurology)  Extended Emergency Contact Information Primary Emergency Contact: Holtman,[name] Address: [address]          [place], [place] [address] [place] Home Phone: [phone] Work Phone: [phone] Relation: Mother Secondary Emergency Contact: Douglas,Marques Address: [address]          [place], [place] [address] [place] Phone: [phone] Relation: Significant other  Code Status:  Full Code  Goals of care: Advanced Directive information    11/21/2022    9:35 AM  Advanced Directives  Does Patient Have a Medical Advance Directive? No     Chief Complaint  Patient presents with   Acute Visit    Patient presents today for headaches, body aches, sore throat, sneezing, runny nose for 4 days now.    HPI:  Pt is a 37 y.o. female seen today for an acute visit for evaluation of headache,generalized body aches,sore throat,runny nose and sneezing x 4 days.states went out for walking for the kids run on Saturday morning.temp was cold and windy.Her boyfriends Also had some cold symptoms.Had some nausea this morning but thinks due to headaches.she denies any fever,chills,wheezing or shortness of breath.   Menses are irregular.LMP September,2024.  Past Medical History:  Diagnosis Date   Allergic rhinitis    Hx of migraines    Lower back pain    Thyroid disease    Past Surgical History:  Procedure Laterality Date   HERNIA REPAIR  1989    Dr.Kathleen Lucas     No Known Allergies  Outpatient Encounter Medications as of 02/10/2023  Medication Sig   amoxicillin-clavulanate (AUGMENTIN) 875-125 MG tablet Take 1 tablet by mouth 2 (two) times daily for 7 days.   Fremanezumab-vfrm (AJOVY) 225 MG/1.5ML SOAJ Inject 225 mg into the skin every 28  (twenty-eight) days.   labetalol (NORMODYNE) 100 MG tablet Take 1.5 tablets (150 mg total) by mouth 2 (two) times daily.   levothyroxine (SYNTHROID) 112 MCG tablet Take 1 tablet (112 mcg total) by mouth daily.   loratadine (CLARITIN) 10 MG tablet Take 1 tablet (10 mg total) by mouth daily.   metFORMIN (GLUCOPHAGE-XR) 500 MG 24 hr tablet Take 500 mg by mouth daily.   ondansetron (ZOFRAN-ODT) 4 MG disintegrating tablet Take 1 tablet (4 mg total) by mouth every 8 (eight) hours as needed for nausea or vomiting.   Rimegepant Sulfate (NURTEC) 75 MG TBDP Take 1 tablet (75 mg total) by mouth daily as needed (migraine).   sodium chloride (OCEAN) 0.65 % nasal spray Place 1 spray into the nose as needed for congestion.   No facility-administered encounter medications on file as of 02/10/2023.    Review of Systems  Constitutional:  Negative for appetite change, chills, fatigue, fever and unexpected weight change.       Generalized body aches  HENT:  Positive for congestion, rhinorrhea, sinus pressure, sinus pain, sneezing and sore throat. Negative for dental problem, ear discharge, ear pain, facial swelling, hearing loss, nosebleeds, postnasal drip, tinnitus and trouble swallowing.   Eyes:  Negative for pain, discharge, redness, itching and visual disturbance.  Respiratory:  Negative for cough, chest tightness, shortness of breath and wheezing.   Cardiovascular:  Negative for chest pain, palpitations and leg swelling.  Gastrointestinal:  Negative for abdominal distention, abdominal pain, constipation, diarrhea, nausea  and vomiting.  Genitourinary:  Negative for difficulty urinating, dysuria, flank pain, frequency and urgency.  Musculoskeletal:  Negative for arthralgias, back pain, gait problem, joint swelling and myalgias.  Skin:  Negative for color change, pallor and rash.  Neurological:  Positive for headaches. Negative for dizziness, weakness, light-headedness and numbness.    Immunization History   Administered Date(s) Administered   Influenza,inj,Quad PF,6+ Mos 01/12/2013, 02/22/2014, 04/03/2015, 04/03/2016, 02/14/2019, 03/02/2020, 03/04/2021   Influenza-Unspecified 12/25/2011   PFIZER(Purple Top)SARS-COV-2 Vaccination 06/30/2019, 07/25/2019, 03/10/2020   PPD Test 12/29/2011, 09/16/2019   Tdap 01/12/2013   Pertinent  Health Maintenance Due  Topic Date Due   INFLUENZA VACCINE  03/01/2023 (Originally 10/30/2022)      03/17/2022   10:56 AM 03/26/2022    1:55 PM 05/30/2022    3:27 PM 11/13/2022   11:39 AM 11/21/2022    9:34 AM  Fall Risk  Falls in the past year? 0 0 0 0 0  Was there an injury with Fall? 0 0 0 0 0  Fall Risk Category Calculator 0 0 0 0 0  Fall Risk Category (Retired) Low Low     (RETIRED) Patient Fall Risk Level Low fall risk Low fall risk     Patient at Risk for Falls Due to No Fall Risks No Fall Risks No Fall Risks  No Fall Risks  Fall risk Follow up Falls evaluation completed Falls evaluation completed Falls evaluation completed Falls evaluation completed Falls evaluation completed   Functional Status Survey:    Vitals:   02/10/23 1335  BP: 128/88  Pulse: 80  Temp: 98.3 F (36.8 C)  SpO2: 98%  Height: 5\' 5"  (1.651 m)   Body mass index is 33.25 kg/m. Physical Exam Vitals reviewed.  Constitutional:      General: She is not in acute distress.    Appearance: Normal appearance. She is normal weight. She is not ill-appearing or diaphoretic.  HENT:     Head: Normocephalic.     Right Ear: Tympanic membrane, ear canal and external ear normal. There is no impacted cerumen.     Left Ear: Tympanic membrane, ear canal and external ear normal. There is no impacted cerumen.     Nose: No congestion or rhinorrhea.     Right Sinus: Maxillary sinus tenderness and frontal sinus tenderness present.     Left Sinus: Maxillary sinus tenderness and frontal sinus tenderness present.     Mouth/Throat:     Mouth: Mucous membranes are moist.     Pharynx: Oropharynx is  clear. No oropharyngeal exudate or posterior oropharyngeal erythema.  Eyes:     General: No scleral icterus.       Right eye: No discharge.        Left eye: No discharge.     Extraocular Movements: Extraocular movements intact.     Conjunctiva/sclera: Conjunctivae normal.     Pupils: Pupils are equal, round, and reactive to light.  Cardiovascular:     Rate and Rhythm: Normal rate and regular rhythm.     Pulses: Normal pulses.     Heart sounds: Normal heart sounds. No murmur heard.    No friction rub. No gallop.  Pulmonary:     Effort: Pulmonary effort is normal. No respiratory distress.     Breath sounds: Normal breath sounds. No wheezing, rhonchi or rales.  Chest:     Chest wall: No tenderness.  Abdominal:     General: Bowel sounds are normal. There is no distension.     Palpations: Abdomen is  soft. There is no mass.     Tenderness: There is no abdominal tenderness. There is no right CVA tenderness, left CVA tenderness, guarding or rebound.  Musculoskeletal:        General: No swelling or tenderness. Normal range of motion.     Cervical back: Normal range of motion. No rigidity or tenderness.     Right lower leg: No edema.     Left lower leg: No edema.  Lymphadenopathy:     Cervical: No cervical adenopathy.  Skin:    General: Skin is warm and dry.     Coloration: Skin is not pale.     Findings: No erythema or rash.  Neurological:     Mental Status: She is alert and oriented to person, place, and time.     Cranial Nerves: No cranial nerve deficit.     Sensory: No sensory deficit.     Motor: No weakness.     Coordination: Coordination normal.     Gait: Gait normal.  Psychiatric:        Mood and Affect: Mood normal.        Speech: Speech normal.        Behavior: Behavior normal.     Labs reviewed: Recent Labs    05/30/22 1602  NA 139  K 4.4  CL 104  CO2 27  GLUCOSE 83  BUN 11  CREATININE 0.81  CALCIUM 9.5   Recent Labs    05/30/22 1602  AST 22  ALT 18   BILITOT 0.5  PROT 7.3   Recent Labs    05/30/22 1602  WBC 5.3  NEUTROABS 2,454  HGB 12.0  HCT 34.5*  MCV 89.6  PLT 306   Lab Results  Component Value Date   TSH 0.23 (L) 10/21/2022   No results found for: "HGBA1C" Lab Results  Component Value Date   CHOL 183 05/30/2022   HDL 70 05/30/2022   LDLCALC 90 05/30/2022   TRIG 132 05/30/2022   CHOLHDL 2.6 05/30/2022    Significant Diagnostic Results in last 30 days:  No results found.  Assessment/Plan  1. Sore throat Afebrile - POC Influenza A/B negative - POC COVID-19 negative -No erythema noted on exam -Encouraged to swish throat with warm water and salt at least once daily - amoxicillin-clavulanate (AUGMENTIN) 875-125 MG tablet; Take 1 tablet by mouth 2 (two) times daily for 7 days.  Dispense: 14 tablet; Refill: 0  2. Body aches Afebrile -Over-the-counter Tylenol as needed for body aches - supportive care with OTC Vitamin D ,C and Zinc  - POC Influenza A/B negative - POC COVID-19 negative  3. Acute non-recurrent maxillary sinusitis Bilateral maxillary and frontal sinus tenderness on exam Will treat with Augmentin - Take loratadine 10 mg tablet one by mouth daily for nasal drainage  -Encouraged to increase fluid intake - amoxicillin-clavulanate (AUGMENTIN) 875-125 MG tablet; Take 1 tablet by mouth 2 (two) times daily for 7 days.  Dispense: 14 tablet; Refill: 0  Family/ staff Communication: Reviewed plan of care with patient verbalized understanding  Labs/tests ordered:  - POC Influenza A/B - POC COVID-19  Next Appointment: Return if symptoms worsen or fail to improve.    Caesar Bookman, NP

## 2023-02-23 ENCOUNTER — Encounter: Payer: Self-pay | Admitting: Internal Medicine

## 2023-02-23 DIAGNOSIS — E039 Hypothyroidism, unspecified: Secondary | ICD-10-CM

## 2023-03-12 ENCOUNTER — Telehealth: Payer: Self-pay

## 2023-03-12 NOTE — Telephone Encounter (Signed)
PA needed for Ajovy 225 mg

## 2023-03-20 ENCOUNTER — Telehealth: Payer: Self-pay

## 2023-03-20 ENCOUNTER — Other Ambulatory Visit (HOSPITAL_COMMUNITY): Payer: Self-pay

## 2023-03-20 NOTE — Telephone Encounter (Signed)
Pharmacy Patient Advocate Encounter   Received notification from Pt Calls Messages that prior authorization for AJOVY (fremanezumab-vfrm) injection 225MG /1.5ML auto-injectors is required/requested.   Insurance verification completed.   The patient is insured through [facility] .   Per test claim: PA required; PA submitted to above mentioned insurance via CoverMyMeds Key/confirmation #/[facility] Status is pending

## 2023-03-20 NOTE — Telephone Encounter (Signed)
PA request has been Submitted. New Encounter created for follow up. For additional info see Pharmacy Prior Auth telephone encounter from 03/20/2023.

## 2023-03-30 ENCOUNTER — Other Ambulatory Visit (HOSPITAL_COMMUNITY): Payer: Self-pay

## 2023-03-30 NOTE — Telephone Encounter (Signed)
Pharmacy Patient Advocate Encounter  Received notification from [facility] that Prior Authorization for AJOVY (fremanezumab-vfrm) injection 225MG /1.5ML auto-injectors has been APPROVED from 03-20-2023 to 03-18-2024. Unable to obtain price due to refill too soon rejection, last fill date 01-31-2023 for quantity of 4.5 mLs and next available fill date is 04-15-2023   PA #/Case ID/Reference #: [facility]

## 2023-04-28 ENCOUNTER — Encounter: Payer: Self-pay | Admitting: Sports Medicine

## 2023-04-28 ENCOUNTER — Ambulatory Visit: Payer: 59 | Admitting: Sports Medicine

## 2023-04-28 VITALS — BP 148/92 | HR 93 | Temp 97.6°F | Resp 16 | Ht 65.0 in | Wt 201.6 lb

## 2023-04-28 DIAGNOSIS — N912 Amenorrhea, unspecified: Secondary | ICD-10-CM | POA: Diagnosis not present

## 2023-04-28 LAB — POCT URINE PREGNANCY: Preg Test, Ur: NEGATIVE

## 2023-04-28 NOTE — Progress Notes (Unsigned)
Careteam: Patient Care Team: Sharon Seller, NP as PCP - General (Geriatric Medicine) Drema Dallas, DO as Consulting Physician (Neurology)  PLACE OF SERVICE:  [facility]  Advanced Directive information Does Patient Have a Medical Advance Directive?: No, Would patient like information on creating a medical advance directive?: No - Patient declined  No Known Allergies  Chief Complaint  Patient presents with  . Acute Visit    Patient complains of painful migraine that travels down left side of neck.      HPI: Patient is a 38 y.o. female ***  Review of Systems:  ROS Negative unless indicated in HPI.   Past Medical History:  Diagnosis Date  . Allergic rhinitis   . Hx of migraines   . Lower back pain   . Thyroid disease    Past Surgical History:  Procedure Laterality Date  . HERNIA REPAIR  1989    Dr.Kathleen Samuel Bouche    Social History:   reports that she quit smoking about 3 years ago. Her smoking use included cigarettes. She started smoking about 5 years ago. She has never used smokeless tobacco. She reports current alcohol use. She reports current drug use. Drug: Marijuana.  Family History  Problem Relation Age of Onset  . Hypertension Mother   . Multiple sclerosis Maternal Aunt   . Breast cancer Maternal Aunt   . Dementia Maternal Grandmother   . Parkinsonism Maternal Grandfather     Medications: Patient's Medications  New Prescriptions   No medications on file  Previous Medications   FREMANEZUMAB-VFRM (AJOVY) 225 MG/1.5ML SOAJ    Inject 225 mg into the skin every 28 (twenty-eight) days.   LABETALOL (NORMODYNE) 100 MG TABLET    Take 1.5 tablets (150 mg total) by mouth 2 (two) times daily.   LEVOTHYROXINE (SYNTHROID) 112 MCG TABLET    Take 1 tablet (112 mcg total) by mouth daily.   LORATADINE (CLARITIN) 10 MG TABLET    Take 10 mg by mouth as needed for allergies.   METFORMIN (GLUCOPHAGE-XR) 500 MG 24 HR TABLET    Take 500 mg by mouth daily.   ONDANSETRON  (ZOFRAN-ODT) 4 MG DISINTEGRATING TABLET    Take 1 tablet (4 mg total) by mouth every 8 (eight) hours as needed for nausea or vomiting.   RIMEGEPANT SULFATE (NURTEC) 75 MG TBDP    Take 1 tablet (75 mg total) by mouth daily as needed (migraine).   SODIUM CHLORIDE (OCEAN) 0.65 % NASAL SPRAY    Place 1 spray into the nose as needed for congestion.  Modified Medications   No medications on file  Discontinued Medications   LORATADINE (CLARITIN) 10 MG TABLET    Take 1 tablet (10 mg total) by mouth daily.    Physical Exam: Vitals:   04/28/23 1458  BP: (!) 148/92  Pulse: 93  Resp: 16  Temp: 97.6 F (36.4 C)  SpO2: 99%  Weight: 201 lb 9.6 oz (91.4 kg)  Height: 5\' 5"  (1.651 m)   Body mass index is 33.55 kg/m. BP Readings from Last 3 Encounters:  04/28/23 (!) 148/92  02/10/23 128/88  12/05/22 122/80   Wt Readings from Last 3 Encounters:  04/28/23 201 lb 9.6 oz (91.4 kg)  12/05/22 199 lb 12.8 oz (90.6 kg)  11/21/22 198 lb (89.8 kg)    Physical Exam Constitutional:      Appearance: Normal appearance.  HENT:     Head: Normocephalic and atraumatic.  Cardiovascular:     Rate and Rhythm: Normal rate  and regular rhythm.  Pulmonary:     Effort: Pulmonary effort is normal. No respiratory distress.     Breath sounds: Normal breath sounds. No wheezing.  Abdominal:     General: Bowel sounds are normal. There is no distension.     Tenderness: There is no abdominal tenderness. There is no guarding or rebound.     Comments:    Musculoskeletal:        General: No swelling or tenderness.  Skin:    General: Skin is dry.  Neurological:     Mental Status: She is alert. Mental status is at baseline.     Sensory: No sensory deficit.     Motor: No weakness.     Comments: Gait normal Rombergs negative Strength and sensations intact  Finger nose neg  Cranial nerve intact     Labs reviewed: Basic Metabolic Panel: Recent Labs    05/30/22 1602 10/21/22 1139  NA 139  --   K 4.4  --   CL  104  --   CO2 27  --   GLUCOSE 83  --   BUN 11  --   CREATININE 0.81  --   CALCIUM 9.5  --   TSH  --  0.23*   Liver Function Tests: Recent Labs    05/30/22 1602  AST 22  ALT 18  BILITOT 0.5  PROT 7.3   No results for input(s): "LIPASE", "AMYLASE" in the last 8760 hours. No results for input(s): "AMMONIA" in the last 8760 hours. CBC: Recent Labs    05/30/22 1602  WBC 5.3  NEUTROABS 2,454  HGB 12.0  HCT 34.5*  MCV 89.6  PLT 306   Lipid Panel: Recent Labs    05/30/22 1602  CHOL 183  HDL 70  LDLCALC 90  TRIG 132  CHOLHDL 2.6   TSH: Recent Labs    10/21/22 1139  TSH 0.23*   A1C: No results found for: "HGBA1C"   Assessment/Plan ***  No follow-ups on file.:   Mariah Rocha

## 2023-05-04 ENCOUNTER — Encounter: Payer: Self-pay | Admitting: Adult Health

## 2023-05-04 ENCOUNTER — Ambulatory Visit: Payer: 59 | Admitting: Adult Health

## 2023-05-04 VITALS — BP 137/88 | HR 89 | Temp 98.0°F | Resp 18 | Ht 65.0 in | Wt 203.2 lb

## 2023-05-04 DIAGNOSIS — E039 Hypothyroidism, unspecified: Secondary | ICD-10-CM | POA: Diagnosis not present

## 2023-05-04 DIAGNOSIS — R519 Headache, unspecified: Secondary | ICD-10-CM

## 2023-05-04 DIAGNOSIS — R6889 Other general symptoms and signs: Secondary | ICD-10-CM | POA: Diagnosis not present

## 2023-05-04 DIAGNOSIS — I10 Essential (primary) hypertension: Secondary | ICD-10-CM | POA: Diagnosis not present

## 2023-05-04 LAB — POC COVID19 BINAXNOW: SARS Coronavirus 2 Ag: NEGATIVE

## 2023-05-04 LAB — POCT RAPID STREP A (OFFICE): Rapid Strep A Screen: NEGATIVE

## 2023-05-04 LAB — POCT INFLUENZA A/B
Influenza A, POC: NEGATIVE
Influenza B, POC: NEGATIVE

## 2023-05-04 MED ORDER — VITAMIN C 1000 MG PO TABS: 1000.0000 mg | ORAL_TABLET | Freq: Every day | ORAL | Status: AC

## 2023-05-04 MED ORDER — ZINC GLUCONATE 50 MG PO TABS: 50.0000 mg | ORAL_TABLET | Freq: Every day | ORAL | Status: AC

## 2023-05-04 NOTE — Progress Notes (Unsigned)
[facility]  Provider:  Kenard Gower DNP  Code Status:  Full Code  Goals of Care:     05/04/2023    3:29 PM  Advanced Directives  Does Patient Have a Medical Advance Directive? No  Would patient like information on creating a medical advance directive? No - Patient declined     Chief Complaint  Patient presents with   Acute Visit    Flu like symptoms   Discussed the use of AI scribe software for clinical note transcription with the patient, who gave verbal consent to proceed.  HPI: Patient is a 38 y.o. female seen today for an acute visit for flu like symptoms.  The patient is a 38 year old female with hypertension and hypothyroidism who presents with flu-like symptoms.  She has been experiencing flu-like symptoms since Saturday night, including body aches, sore throat, headaches, and nasal congestion. 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 She has cough with whitish phlegm, chills, fever, vomiting, or diarrhea. She describes the cough as dry and mentions that today has been the worst day for her symptoms, with pain rated as a ten out of ten, affecting her throat, head, and legs. She has been taking DayQuil and Tylenol for the body aches, which provide only slight relief. She has not received her flu vaccine this year and mentions a history of having the flu and COVID-19 in consecutive months last year.  She has a history of hypertension and takes labetalol, though she has not checked her blood pressure at home recently. She was seen last week for high blood pressure associated with a migraine. She uses Ajovy injections monthly and Nurtec as needed for migraine management.  She has hypothyroidism and takes levothyroxine. Her last TSH was checked six months ago, and she has not seen her endocrinologist since then.  She was previously prescribed metformin by her OBGYN to regulate her menstrual  cycles while considering fertility treatment, but she is not diabetic.        Past Medical History:  Diagnosis Date   Allergic rhinitis    Hx of migraines    Lower back pain    Thyroid disease     Past Surgical History:  Procedure Laterality Date   HERNIA REPAIR  1989    Dr.Kathleen Lucas     No Known Allergies  Outpatient Encounter Medications as of 05/04/2023  Medication Sig   Fremanezumab-vfrm (AJOVY) 225 MG/1.5ML SOAJ Inject 225 mg into the skin every 28 (twenty-eight) days.   labetalol (NORMODYNE) 100 MG tablet Take 1.5 tablets (150 mg total) by mouth 2 (two) times daily.   levothyroxine (SYNTHROID) 112 MCG tablet Take 1 tablet (112 mcg total) by mouth daily.   loratadine (CLARITIN) 10 MG tablet Take 10 mg by mouth as needed for allergies.   metFORMIN (GLUCOPHAGE-XR) 500 MG 24 hr tablet Take 500 mg by mouth daily.   ondansetron (ZOFRAN-ODT) 4 MG disintegrating tablet Take 1 tablet (4 mg total) by mouth every 8 (eight) hours as needed for nausea or vomiting.   Rimegepant Sulfate (NURTEC) 75 MG TBDP Take 1 tablet (75 mg total) by mouth daily as needed (migraine).   sodium chloride (OCEAN) 0.65 % nasal spray Place 1 spray into the nose as needed for congestion.   No facility-administered encounter medications on file as of 05/04/2023.    Review of Systems:  Review of Systems  Constitutional:  Negative for appetite  change, chills, fatigue and fever.  HENT:  Positive for congestion and sore throat. Negative for hearing loss and rhinorrhea.        Nasal congestion  Eyes: Negative.   Respiratory:  Positive for cough. Negative for shortness of breath and wheezing.   Cardiovascular:  Negative for chest pain, palpitations and leg swelling.  Gastrointestinal:  Negative for abdominal pain, constipation, diarrhea, nausea and vomiting.  Genitourinary:  Negative for dysuria.  Musculoskeletal:  Positive for arthralgias and myalgias. Negative for back pain.  Skin:  Negative for color  change, rash and wound.  Neurological:  Negative for dizziness, weakness and headaches.  Psychiatric/Behavioral:  Negative for behavioral problems. The patient is not nervous/anxious.     Health Maintenance  Topic Date Due   INFLUENZA VACCINE  10/30/2022   COVID-19 Vaccine (4 - 2024-25 season) 11/30/2022   DTaP/Tdap/Td (2 - Td or Tdap) 01/13/2023   Cervical Cancer Screening (HPV/Pap Cotest)  10/10/2027   Hepatitis C Screening  Completed   HIV Screening  Completed   HPV VACCINES  Aged Out    Physical Exam: There were no vitals filed for this visit. There is no height or weight on file to calculate BMI. Physical Exam Constitutional:      Appearance: Normal appearance.  HENT:     Head: Normocephalic and atraumatic.     Nose: Nose normal.     Mouth/Throat:     Mouth: Mucous membranes are moist.  Eyes:     Conjunctiva/sclera: Conjunctivae normal.  Cardiovascular:     Rate and Rhythm: Normal rate and regular rhythm.  Pulmonary:     Effort: Pulmonary effort is normal.     Breath sounds: Normal breath sounds.  Abdominal:     General: Bowel sounds are normal.     Palpations: Abdomen is soft.  Musculoskeletal:        General: Normal range of motion.     Cervical back: Normal range of motion.  Skin:    General: Skin is warm and dry.  Neurological:     General: No focal deficit present.     Mental Status: She is alert and oriented to person, place, and time.  Psychiatric:        Mood and Affect: Mood normal.        Behavior: Behavior normal.        Thought Content: Thought content normal.        Judgment: Judgment normal.     Labs reviewed: Basic Metabolic Panel: Recent Labs    05/30/22 1602 10/21/22 1139  NA 139  --   K 4.4  --   CL 104  --   CO2 27  --   GLUCOSE 83  --   BUN 11  --   CREATININE 0.81  --   CALCIUM 9.5  --   TSH  --  0.23*   Liver Function Tests: Recent Labs    05/30/22 1602  AST 22  ALT 18  BILITOT 0.5  PROT 7.3   No results for  input(s): "LIPASE", "AMYLASE" in the last 8760 hours. No results for input(s): "AMMONIA" in the last 8760 hours. CBC: Recent Labs    05/30/22 1602  WBC 5.3  NEUTROABS 2,454  HGB 12.0  HCT 34.5*  MCV 89.6  PLT 306   Lipid Panel: Recent Labs    05/30/22 1602  CHOL 183  HDL 70  LDLCALC 90  TRIG 132  CHOLHDL 2.6   No results found for: "HGBA1C"  Procedures  since last visit: No results found.  Assessment/Plan     Labs/tests ordered:  POC strep A, Influenza A/B, COVID-19 test  Next appt:  Visit date not found

## 2023-05-05 ENCOUNTER — Ambulatory Visit: Payer: 59 | Admitting: Nurse Practitioner

## 2023-05-15 NOTE — Progress Notes (Signed)
 Virtual Visit via Video Note  Consent was obtained for video visit:  Yes.   Answered questions that patient had about telehealth interaction:  Yes.   I discussed the limitations, risks, security and privacy concerns of performing an evaluation and management service by telemedicine. I also discussed with the patient that there may be a patient responsible charge related to this service. The patient expressed understanding and agreed to proceed.  Pt location: Home Physician Location: office Name of referring provider:  Sharon Seller, NP I connected with Bridget Hartshorn at patients initiation/request on 05/18/2023 at  2:10 PM EST by video enabled telemedicine application and verified that I am speaking with the correct person using two identifiers. Pt MRN:  [id number] Pt DOB:  1985-10-15 Video Participants:  Bridget Hartshorn  Assessment/Plan:   Migraine without aura, without status migrainosus, not intractable - increased frequency   Migraine prevention:  Plan to change from Ajovy to Emgality Migraine rescue:  Will have her try samples of Ubrelvy 100mg .  Zofran ODT 4mg  for nausea  Limit use of pain relievers to no more than 2 days out of week to prevent risk of rebound or medication-overuse headache. Keep headache diary Follow up 6 months.  Subjective:  Mariah Rocha is a 38 year old female with hypothyroidism, left sided hearing loss and hypertension who follows up for migraine.  UPDATE: More frequent migraines over past 3 months.   She has had increased seasonal allergies and was previously having fluctuating high blood pressure but that has normalized.  Intensity:  10/10 Duration:  4-6 hours with Nurtec. Frequency:  2 to 3 migraines a week Current NSAIDS/analgesics:  Tylenol Current triptans:  none Current ergotamine:  none Current anti-emetic:  Zofran 4mg  Current muscle relaxants:  none Current Antihypertensive medications:  labetalol Current Antidepressant  medications:  none Current Anticonvulsant medications:  none Current anti-CGRP:  Ajovy, Nurtec 75mg  PRN  Current Vitamins/Herbal/Supplements:  none Current Antihistamines/Decongestants:  loratadine Other therapy:  none Birth control:  none Other medications:  levothyroxine, metformin     Caffeine:  1 cup coffee in morning.  Limits soda Diet:  Limits soda.  Increased water intake.  Does not skip meals. Exercise:  not routine.  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 Sleep hygiene:  okay.  Variable.  Full time in front office in school and works part-time 3 days a week at [employer] (closes up) - usually 5 to 6 hours of sleep a night.  HISTORY:  Onset:  childhood Location:  mid-frontal and radiates to either/both side of front radiating to back of head and down neck into shoulders Quality:  pressure, throbbing Intensity:  10/10.   Aura:  absent Prodrome:  absent Associated symptoms:  nausea, photophobia, phonophobia, osmophobia, lightheadedness.  She denies associated vomiting, visual disturbance, unilateral numbness or weakness. Duration:  all day; sumatriptan NS - 1-2 hours with residual pain for rest of day and returns at bedtime Frequency:  every day to every other day Frequency of abortive medication: every other day Triggers:  certain smells (cologne, perfume) Relieving factors:  rest Activity:  affects work   05/08/2016 CT HEAD WO:  Normal study She has left sided hearing loss since at least 2006.  07/09/2004 MRI BRAIN & TEMPORAL BONES W WO:  Negative examination.  It is genetic.   Past NSAIDS/analgesics:  ibuprofen Past abortive triptans:  sumatriptan  tab/NS Past abortive ergotamine:  none Past muscle relaxants:  none Past anti-emetic:  Zofran Past antihypertensive medications:  propranolol, amlodipine Past antidepressant medications:   duloxetine Past anticonvulsant medications:  topiramate Past anti-CGRP:  none Past vitamins/Herbal/Supplements:  none Past antihistamines/decongestants:  Benadryl Other past therapies:  none    Family history of headache:  no  Past Medical History: Past Medical History:  Diagnosis Date   Allergic rhinitis    Hx of migraines    Lower back pain    Thyroid disease     Medications: Outpatient Encounter Medications as of 05/18/2023  Medication Sig   Fremanezumab-vfrm (AJOVY) 225 MG/1.5ML SOAJ Inject 225 mg into the skin every 28 (twenty-eight) days.   labetalol (NORMODYNE) 100 MG tablet Take 1.5 tablets (150 mg total) by mouth 2 (two) times daily.   levothyroxine (SYNTHROID) 112 MCG tablet Take 1 tablet (112 mcg total) by mouth daily.   loratadine (CLARITIN) 10 MG tablet Take 10 mg by mouth as needed for allergies.   metFORMIN (GLUCOPHAGE-XR) 500 MG 24 hr tablet Take 500 mg by mouth daily.   ondansetron (ZOFRAN-ODT) 4 MG disintegrating tablet Take 1 tablet (4 mg total) by mouth every 8 (eight) hours as needed for nausea or vomiting.   Rimegepant Sulfate (NURTEC) 75 MG TBDP Take 1 tablet (75 mg total) by mouth daily as needed (migraine).   sodium chloride (OCEAN) 0.65 % nasal spray Place 1 spray into the nose as needed for congestion.   No facility-administered encounter medications on file as of 05/18/2023.    Allergies: No Known Allergies  Family History: Family History  Problem Relation Age of Onset   Hypertension Mother    Multiple sclerosis Maternal Aunt    Breast cancer Maternal Aunt    Dementia Maternal Grandmother    Parkinsonism Maternal Grandfather     Observations/Objective:   No acute distress.  Alert and oriented.  Speech fluent and not dysarthric.  Language intact.     Follow Up Instructions:    -I discussed the assessment and treatment plan with the patient. The patient was provided an opportunity to ask questions and all were answered. The patient agreed  with the plan and demonstrated an understanding of the instructions.   The patient was advised to call back or seek an in-person evaluation if the symptoms worsen or if the condition fails to improve as anticipated.   Cira Servant, DO   CC: Abbey Chatters, NP

## 2023-05-18 ENCOUNTER — Telehealth (INDEPENDENT_AMBULATORY_CARE_PROVIDER_SITE_OTHER): Payer: 59 | Admitting: Neurology

## 2023-05-18 ENCOUNTER — Encounter: Payer: Self-pay | Admitting: Neurology

## 2023-05-18 DIAGNOSIS — G43009 Migraine without aura, not intractable, without status migrainosus: Secondary | ICD-10-CM

## 2023-05-20 ENCOUNTER — Encounter: Payer: Self-pay | Admitting: Nurse Practitioner

## 2023-05-22 ENCOUNTER — Other Ambulatory Visit: Payer: Self-pay | Admitting: Neurology

## 2023-05-22 MED ORDER — EMGALITY 120 MG/ML ~~LOC~~ SOAJ: 120.0000 mg | SUBCUTANEOUS | 11 refills | Status: DC

## 2023-05-29 ENCOUNTER — Telehealth: Payer: Self-pay

## 2023-05-29 NOTE — Telephone Encounter (Signed)
 PA needed   [employer]

## 2023-06-02 ENCOUNTER — Telehealth: Payer: Self-pay

## 2023-06-02 NOTE — Telephone Encounter (Signed)
Status of PA.

## 2023-06-02 NOTE — Telephone Encounter (Signed)
 PA needed for Nurtec.

## 2023-06-03 ENCOUNTER — Telehealth: Payer: Self-pay | Admitting: Pharmacist

## 2023-06-03 ENCOUNTER — Telehealth: Payer: Self-pay

## 2023-06-03 NOTE — Telephone Encounter (Signed)
 Pharmacy Patient Advocate Encounter   Received notification from Patient Pharmacy that prior authorization for Nurtec 75MG  dispersible tablets is required/requested.   Insurance verification completed.   The patient is insured through [facility] .   Per test claim: PA required; PA submitted to above mentioned insurance via CoverMyMeds Key/confirmation #/EOC [id number] Status is pending

## 2023-06-03 NOTE — Telephone Encounter (Signed)
 PA request has been Submitted. New Encounter has been or will be created for follow up. For additional info see Pharmacy Prior Auth telephone encounter from 03/05.

## 2023-06-03 NOTE — Telephone Encounter (Signed)
*  [facility]  Pharmacy Patient Advocate Encounter   Received notification from Pt Calls Messages that prior authorization for Emgality 120MG /ML auto-injectors (migraine)  is required/requested.   Insurance verification completed.   The patient is insured through [facility] [employer] [facility] .   Per test claim: PA required; PA started via CoverMyMeds. KEY [id number] . Waiting for clinical questions to populate.

## 2023-06-04 ENCOUNTER — Other Ambulatory Visit (HOSPITAL_COMMUNITY): Payer: Self-pay

## 2023-06-04 NOTE — Telephone Encounter (Signed)
 Clinical questions populated, answered, and submitted for determination.

## 2023-06-04 NOTE — Telephone Encounter (Signed)
 Pharmacy Patient Advocate Encounter  Received notification from [facility] that Prior Authorization for Nurtec 75MG  dispersible tablets has been APPROVED for 16 tablets per month with a $0.00 copay.   PA #/Case ID/Reference #:  [id number]

## 2023-06-05 ENCOUNTER — Other Ambulatory Visit (HOSPITAL_COMMUNITY): Payer: Self-pay

## 2023-06-05 NOTE — Telephone Encounter (Signed)
 Pharmacy Patient Advocate Encounter  Received notification from [facility] that Prior Authorization for [facility] 120MG  has been APPROVED from 3.6.25 to 6.6.25. Unable to obtain price due to refill too soon rejection, last fill date 3.6.25 next available fill date4.3.25   PA #/Case ID/Reference #: [id number]

## 2023-07-02 ENCOUNTER — Encounter: Payer: Self-pay | Admitting: Internal Medicine

## 2023-07-02 ENCOUNTER — Ambulatory Visit (INDEPENDENT_AMBULATORY_CARE_PROVIDER_SITE_OTHER): Admitting: Internal Medicine

## 2023-07-02 VITALS — BP 124/80 | HR 84 | Ht 65.0 in | Wt 203.0 lb

## 2023-07-02 DIAGNOSIS — Z3A01 Less than 8 weeks gestation of pregnancy: Secondary | ICD-10-CM | POA: Diagnosis not present

## 2023-07-02 DIAGNOSIS — E039 Hypothyroidism, unspecified: Secondary | ICD-10-CM | POA: Diagnosis not present

## 2023-07-02 LAB — T4: T4, Total: 8.4 ug/dL (ref 5.1–11.9)

## 2023-07-02 LAB — TSH: TSH: 3.5 m[IU]/L

## 2023-07-02 NOTE — Patient Instructions (Signed)

## 2023-07-02 NOTE — Progress Notes (Unsigned)
 Name: Mariah Rocha  MRN/ DOB: [id number], 01-01-1986    Age/ Sex: 38 y.o., female     PCP: Sharon Seller, NP   Reason for Endocrinology Evaluation: Hypothyroidism     Initial Endocrinology Clinic Visit: 06/04/2018    PATIENT IDENTIFIER: Mariah Rocha is a 38 y.o., female with a past medical history of Graves' disease. She has followed with [facility] since 06/04/2018 for consultative assistance with management of her hypothyroidism.   HISTORICAL SUMMARY: The patient was first diagnosed with hyperthyroidism secondary to Graves' disease in 2015.  She was started on Methimazole that she took for 2 years but stopped due to lack of insurance. She presented again to her PCP in 03/2018 for evaluation of headaches, during work up she was foun to have elevated TSH at 6.29 uIU/mL.  She was started on levothyroxine 25 MCG daily by her PCP.   Paternal aunt with thyroid disease.   Patient became pregnant 05/2023   SUBJECTIVE:     Today (07/02/2023):  Mariah Rocha is here for a follow up on hypothyroidism and hx of elevated prolactin.    Weight has been stable  Denies local neck swelling  Denies diarrhea but has mild constipation  Rare palpitations  LMP last month Tested positive for pregnancy last week ~ 5 weeks , was seen by Raquel Sarna  EDD 01/2024  Denies galactorrhea  Has migraines which has bene improving  No vision changes    Levothyroxine 112 mcg , half on Sunday and 1 tab rest of the week - has been taking 1 tablet daily    HISTORY:  Past Medical History:  Past Medical History:  Diagnosis Date   Allergic rhinitis    Hx of migraines    Lower back pain    Thyroid disease    Past Surgical History:  Past Surgical History:  Procedure Laterality Date   HERNIA REPAIR  1989    Dr.Kathleen Samuel Bouche    Social History:  reports that she quit smoking about 3 years ago. Her smoking use included cigarettes. She started smoking about 5 years ago. She has  never used smokeless tobacco. She reports current alcohol use. She reports current drug use. Drug: Marijuana. Family History:  Family History  Problem Relation Age of Onset   Hypertension Mother    Multiple sclerosis Maternal Aunt    Breast cancer Maternal Aunt    Dementia Maternal Grandmother    Parkinsonism Maternal Grandfather      HOME MEDICATIONS: Allergies as of 07/02/2023   No Known Allergies      Medication List        Accurate as of July 02, 2023  8:09 AM. If you have any questions, ask your nurse or doctor.          Emgality 120 MG/ML Soaj Generic drug: Galcanezumab-gnlm Inject 120 mg into the skin every 28 (twenty-eight) days.   labetalol 100 MG tablet Commonly known as: NORMODYNE Take 1.5 tablets (150 mg total) by mouth 2 (two) times daily.   levothyroxine 112 MCG tablet Commonly known as: SYNTHROID Take 1 tablet (112 mcg total) by mouth daily.   loratadine 10 MG tablet Commonly known as: CLARITIN Take 10 mg by mouth as needed for allergies.   metFORMIN 500 MG 24 hr tablet Commonly known as: GLUCOPHAGE-XR Take 500 mg by mouth daily.   Nurtec 75 MG Tbdp Generic drug: Rimegepant Sulfate Take 1 tablet (75 mg total) by mouth daily as needed (migraine).   ondansetron  4 MG disintegrating tablet Commonly known as: ZOFRAN-ODT Take 1 tablet (4 mg total) by mouth every 8 (eight) hours as needed for nausea or vomiting.   sodium chloride 0.65 % nasal spray Commonly known as: OCEAN Place 1 spray into the nose as needed for congestion.       PHYSICAL EXAM: VS: BP 124/80 (BP Location: Left Arm, Patient Position: Sitting, Cuff Size: Normal)   Pulse 84   Ht 5\' 5"  (1.651 m)   Wt 203 lb (92.1 kg)   SpO2 99%   BMI 33.78 kg/m    EXAM: General: Pt appears well and is in NAD  Neck: General: Supple without adenopathy. Thyroid: Thyroid gland normal.  Lungs: Clear with good BS bilat   Heart: Auscultation: RRR   Abdomen: Soft, nontender  Extremities: BL  LE: no pretibial edema   Mental Status: Judgment, insight: intact Orientation: oriented to time, place, and person Mood and affect: no depression, anxiety, or agitation     DATA REVIEWED: ***   Thyroid Ultrasound 03/01/2019  Heterogeneous thyroid indicating medical thyroid disease.  ASSESSMENT / PLAN / RECOMMENDATIONS:   Hypothyroidism:   Clinically euthyroid  No local neck symptoms  Preconception counseling performed, patient to increase LT-4 replacement by 20% with positive pregnancy test.  We discussed increased LT-4 requirements during pregnancy TSH trending up again, will increase levothyroxine   Medications   Levothyroxine 112 mcg daily     F/u in 2 months    Signed electronically by: Lyndle Herrlich, MD  [facility]  [facility] [address] [place] [place], [place] [address] Phone: [phone] FAX: [phone]   CC: Sharon Seller, NP [address] [place] [address] Phone: [phone] Fax: [phone]   Return to Endocrinology clinic as below: Future Appointments  Date Time Provider Department Center  07/02/2023  8:10 AM Arbor Cohen, Konrad Dolores, MD [facility] None  07/03/2023 10:00 AM Sharon Seller, NP [facility] None  11/16/2023  2:10 PM Drema Dallas, DO [facility] None  12/07/2023  1:40 PM Sharon Seller, NP [facility] None

## 2023-07-03 ENCOUNTER — Encounter: Payer: Self-pay | Admitting: Internal Medicine

## 2023-07-03 ENCOUNTER — Ambulatory Visit: Admitting: Nurse Practitioner

## 2023-07-03 VITALS — BP 122/72 | HR 86 | Temp 97.2°F | Resp 16 | Ht 65.0 in | Wt 203.2 lb

## 2023-07-03 DIAGNOSIS — E039 Hypothyroidism, unspecified: Secondary | ICD-10-CM

## 2023-07-03 DIAGNOSIS — R519 Headache, unspecified: Secondary | ICD-10-CM | POA: Diagnosis not present

## 2023-07-03 DIAGNOSIS — Z3A01 Less than 8 weeks gestation of pregnancy: Secondary | ICD-10-CM

## 2023-07-03 DIAGNOSIS — I10 Essential (primary) hypertension: Secondary | ICD-10-CM | POA: Diagnosis not present

## 2023-07-03 MED ORDER — LEVOTHYROXINE SODIUM 125 MCG PO TABS
125.0000 ug | ORAL_TABLET | Freq: Every day | ORAL | 4 refills | Status: DC
Start: 2023-07-03 — End: 2023-10-13

## 2023-07-03 NOTE — Progress Notes (Signed)
 Careteam: Patient Care Team: Sharon Seller, NP as PCP - General (Geriatric Medicine) Drema Dallas, DO as Consulting Physician (Neurology)  PLACE OF SERVICE:  [facility]  Advanced Directive information    No Known Allergies  Chief Complaint  Patient presents with   Amenorrhea    Patient was doing a vaginal ultrasound and found out she was pregnant.    Discussed the use of AI scribe software for clinical note transcription with the patient, who gave verbal consent to proceed.  History of Present Illness   Mariah Rocha is a 38 year old female with hypertension and hypothyroidism who presents with a positive pregnancy test.  She recently discovered her pregnancy during an OB GYN appointment where a polyp was initially suspected. Blood work confirmed the pregnancy, and she is approximately five weeks pregnant with an estimated due date in early to late November based on her last menstrual cycle in February.  She is currently taking labetalol for hypertension, which was started when she was trying to conceive. Her blood pressure is well-controlled with this medication. She has discontinued other medications previously on her list.  She is on levothyroxine for hypothyroidism and continues to take this medication as prescribed to maintain thyroid regulation during pregnancy.  She is experiencing nausea for the first time today and significant fatigue, stating 'all I want to do is lay down.' She has stopped drinking coffee to reduce caffeine intake and is staying hydrated with water and occasional sports drinks. She is mindful of her diet, aiming to maintain proper nutrition during her pregnancy.  She experiences occasional headaches, which she attributes to sinus issues rather than migraines. For headache relief, she has been using Tylenol, which she finds effective. She has not experienced severe migraines recently but plans to check in with neurology.    Review of Systems:   Review of Systems  Constitutional:  Negative for chills, fever and weight loss.  HENT:  Negative for tinnitus.   Respiratory:  Negative for cough, sputum production and shortness of breath.   Cardiovascular:  Negative for chest pain, palpitations and leg swelling.  Gastrointestinal:  Positive for nausea. Negative for abdominal pain, constipation, diarrhea and heartburn.  Genitourinary:  Negative for dysuria, frequency and urgency.  Musculoskeletal:  Negative for back pain, falls, joint pain and myalgias.  Skin: Negative.   Neurological:  Negative for dizziness and headaches.  Psychiatric/Behavioral:  Negative for depression and memory loss. The patient does not have insomnia.     Past Medical History:  Diagnosis Date   Allergic rhinitis    Hx of migraines    Lower back pain    Thyroid disease    Past Surgical History:  Procedure Laterality Date   HERNIA REPAIR  1989    Dr.Kathleen Samuel Bouche    Social History:   reports that she quit smoking about 3 years ago. Her smoking use included cigarettes. She started smoking about 5 years ago. She has never used smokeless tobacco. She reports that she does not currently use alcohol. She reports that she does not currently use drugs after having used the following drugs: Marijuana.  Family History  Problem Relation Age of Onset   Hypertension Mother    Multiple sclerosis Maternal Aunt    Breast cancer Maternal Aunt    Dementia Maternal Grandmother    Parkinsonism Maternal Grandfather     Medications: Patient's Medications  New Prescriptions   No medications on file  Previous Medications   LABETALOL (NORMODYNE) 100  MG TABLET    Take 1.5 tablets (150 mg total) by mouth 2 (two) times daily.   LEVOTHYROXINE (SYNTHROID) 112 MCG TABLET    Take 1 tablet (112 mcg total) by mouth daily.  Modified Medications   No medications on file  Discontinued Medications   GALCANEZUMAB-GNLM (EMGALITY) 120 MG/ML SOAJ    Inject 120 mg into the skin every  28 (twenty-eight) days.   LORATADINE (CLARITIN) 10 MG TABLET    Take 10 mg by mouth as needed for allergies.   METFORMIN (GLUCOPHAGE-XR) 500 MG 24 HR TABLET    Take 500 mg by mouth daily.   ONDANSETRON (ZOFRAN-ODT) 4 MG DISINTEGRATING TABLET    Take 1 tablet (4 mg total) by mouth every 8 (eight) hours as needed for nausea or vomiting.   RIMEGEPANT SULFATE (NURTEC) 75 MG TBDP    Take 1 tablet (75 mg total) by mouth daily as needed (migraine).   SODIUM CHLORIDE (OCEAN) 0.65 % NASAL SPRAY    Place 1 spray into the nose as needed for congestion.    Physical Exam:  Vitals:   07/03/23 0953  BP: 122/72  Pulse: 86  Resp: 16  Temp: (!) 97.2 F (36.2 C)  SpO2: 98%  Weight: 203 lb 3.2 oz (92.2 kg)  Height: 5\' 5"  (1.651 m)   Body mass index is 33.81 kg/m. Wt Readings from Last 3 Encounters:  07/03/23 203 lb 3.2 oz (92.2 kg)  07/02/23 203 lb (92.1 kg)  05/04/23 203 lb 3.2 oz (92.2 kg)    Physical Exam Constitutional:      General: She is not in acute distress.    Appearance: She is well-developed. She is not diaphoretic.  HENT:     Head: Normocephalic and atraumatic.     Mouth/Throat:     Pharynx: No oropharyngeal exudate.  Eyes:     Conjunctiva/sclera: Conjunctivae normal.     Pupils: Pupils are equal, round, and reactive to light.  Cardiovascular:     Rate and Rhythm: Normal rate and regular rhythm.     Heart sounds: Normal heart sounds.  Pulmonary:     Effort: Pulmonary effort is normal.     Breath sounds: Normal breath sounds.  Abdominal:     General: Bowel sounds are normal.     Palpations: Abdomen is soft.  Musculoskeletal:     Cervical back: Normal range of motion and neck supple.     Right lower leg: No edema.     Left lower leg: No edema.  Skin:    General: Skin is warm and dry.  Neurological:     Mental Status: She is alert.  Psychiatric:        Mood and Affect: Mood normal.     Labs reviewed: Basic Metabolic Panel: Recent Labs    10/21/22 1139  07/02/23 0837  TSH 0.23* 3.50   Liver Function Tests: No results for input(s): "AST", "ALT", "ALKPHOS", "BILITOT", "PROT", "ALBUMIN" in the last 8760 hours. No results for input(s): "LIPASE", "AMYLASE" in the last 8760 hours. No results for input(s): "AMMONIA" in the last 8760 hours. CBC: No results for input(s): "WBC", "NEUTROABS", "HGB", "HCT", "MCV", "PLT" in the last 8760 hours. Lipid Panel: No results for input(s): "CHOL", "HDL", "LDLCALC", "TRIG", "CHOLHDL", "LDLDIRECT" in the last 8760 hours. TSH: Recent Labs    10/21/22 1139 07/02/23 0837  TSH 0.23* 3.50   A1C: No results found for: "HGBA1C"   Assessment/Plan Assessment and Plan    Pregnancy Confirmed five weeks pregnant. Mild nausea  and fatigue noted. Blood pressure well-controlled with labetalol. Levothyroxine continued for thyroid regulation. Discussed nutrition, caffeine avoidance, and safe headache management. Advised on compression stockings if needed for swelling. - Continue labetalol for blood pressure management. - Continue levothyroxine for thyroid regulation. - Avoid extra medications- ob should give her pregnancy safe list at appt - Maintain a low sodium diet. - Ensure proper nutrition - Follow up with OB GYN for pregnancy management. - Schedule appointment with neurologist regarding migraine medication.  Hypertension Chronic hypertension well-controlled with labetalol. Emphasized low sodium diet. - Continue labetalol for blood pressure management. - Maintain a low sodium diet.  Hypothyroidism Managed with levothyroxine. Regular thyroid function monitoring essential and followed by endocrinology - Continue levothyroxine for thyroid regulation.      Janene Harvey. Biagio Borg [facility] [phone]

## 2023-07-07 ENCOUNTER — Encounter: Payer: Self-pay | Admitting: Neurology

## 2023-08-06 ENCOUNTER — Telehealth: Payer: Self-pay | Admitting: Pharmacy Technician

## 2023-08-06 NOTE — Telephone Encounter (Signed)
 Pharmacy Patient Advocate Encounter   Received notification from CoverMyMeds that prior authorization for EMGALITY  120MG  is required/requested.   Insurance verification completed.   The patient is insured through [employer] [facility] .   Per test claim: PA required; PA submitted to above mentioned insurance via CoverMyMeds Key/confirmation #/EOC [id number] Status is pending

## 2023-08-07 ENCOUNTER — Other Ambulatory Visit (HOSPITAL_COMMUNITY): Payer: Self-pay

## 2023-08-07 NOTE — Telephone Encounter (Signed)
 Pharmacy Patient Advocate Encounter  Received notification from [employer] [facility] that Prior Authorization for EMGALITY  120MG  has been CANCELLED due to    Test billing with [facility] returns a $35 copay/per 30 days.

## 2023-08-11 ENCOUNTER — Encounter: Payer: Self-pay | Admitting: Internal Medicine

## 2023-09-01 ENCOUNTER — Ambulatory Visit: Admitting: Internal Medicine

## 2023-09-23 ENCOUNTER — Other Ambulatory Visit (HOSPITAL_COMMUNITY): Payer: Self-pay

## 2023-09-23 ENCOUNTER — Telehealth: Payer: Self-pay

## 2023-09-23 NOTE — Telephone Encounter (Signed)
 Pharmacy Patient Advocate Encounter   Received notification from CoverMyMeds that prior authorization for Emgality  120MG /ML auto-injectors (migraine) is required/requested.   Insurance verification completed.   The patient is insured through [employer] [facility] .   Per test claim: PA required; PA started via CoverMyMeds. KEY [id number] . Waiting for clinical questions to populate.

## 2023-10-13 ENCOUNTER — Other Ambulatory Visit: Payer: Self-pay

## 2023-10-13 MED ORDER — LEVOTHYROXINE SODIUM 125 MCG PO TABS: 125.0000 ug | ORAL_TABLET | Freq: Every day | ORAL | 4 refills | Status: DC

## 2023-11-13 NOTE — Progress Notes (Deleted)
 NEUROLOGY FOLLOW UP OFFICE NOTE  Mariah Rocha [id number]  Assessment/Plan:   Migraine without aura, without status migrainosus, not intractable - increased frequency   Migraine prevention:  Plan to change from Ajovy  to Emgality  Migraine rescue:  Will have her try samples of Ubrelvy 100mg .  Zofran  ODT 4mg  for nausea  Limit use of pain relievers to no more than 2 days out of week to prevent risk of rebound or medication-overuse headache. Keep headache diary Follow up 6 months.  Subjective:  Mariah Rocha is a 38 year old female with hypothyroidism, left sided hearing loss and hypertension who follows up for migraine.  UPDATE: Due to more frequent headaches, Ajovy  was changed to Emgality  in March.  She found out she was pregnant ***   She has had increased seasonal allergies and was previously having fluctuating high blood pressure but that has normalized.  Intensity:  10/10 Duration:  4-6 hours with Nurtec. Frequency:  2 to 3 migraines a week Current NSAIDS/analgesics:  Tylenol Current triptans:  none Current ergotamine:  none Current anti-emetic:  Zofran  4mg  Current muscle relaxants:  none Current Antihypertensive medications:  labetalol  Current Antidepressant medications:  none Current Anticonvulsant medications:  none Current anti-CGRP:  Emgality , Nurtec 75mg  PRN  Current Vitamins/Herbal/Supplements:  none Current Antihistamines/Decongestants:  loratadine  Other therapy:  none Birth control:  none Other medications:  levothyroxine , metformin     Caffeine:  1 cup coffee in morning.  Limits soda Diet:  Limits soda.  Increased water intake.  Does not skip meals. Exercise:  not routine.  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 Sleep hygiene:  okay.  Variable.  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  HISTORY:  Onset:  childhood Location:  mid-frontal and radiates to either/both side of front radiating to back of head and down neck into shoulders Quality:  pressure, throbbing Intensity:  10/10.   Aura:  absent Prodrome:  absent Associated symptoms:  nausea, photophobia, phonophobia, osmophobia, lightheadedness.  She denies associated vomiting, visual disturbance, unilateral numbness or weakness. Duration:  all day; sumatriptan  NS - 1-2 hours with residual pain for rest of day and returns at bedtime Frequency:  every day to every other day Frequency of abortive medication: every other day Triggers:  certain smells (cologne, perfume) Relieving factors:  rest Activity:  affects work   05/08/2016 CT HEAD WO:  Normal study She has left sided hearing loss since at least 2006.  07/09/2004 MRI BRAIN & TEMPORAL BONES W WO:  Negative examination.  It is genetic.   Past NSAIDS/analgesics:  ibuprofen Past abortive triptans:  sumatriptan  tab/NS Past abortive ergotamine:  none Past muscle relaxants:  none Past anti-emetic:  Zofran  Past antihypertensive medications:  propranolol , amlodipine  Past antidepressant medications:  duloxetine  Past anticonvulsant medications:  topiramate  Past anti-CGRP:  none Past vitamins/Herbal/Supplements:  none Past antihistamines/decongestants:  Benadryl Other past therapies:  none    Family history of headache:  no  PAST MEDICAL HISTORY: Past Medical History:  Diagnosis Date   Allergic rhinitis    Hx of migraines    Lower back pain    Thyroid  disease     MEDICATIONS: Current Outpatient Medications on File Prior to Visit  Medication Sig Dispense Refill   labetalol  (NORMODYNE ) 100 MG  tablet Take 1.5 tablets (150 mg total) by mouth 2 (two) times daily. 270 tablet 1   levothyroxine  (SYNTHROID ) 125 MCG tablet Take 1 tablet (125 mcg total) by mouth daily before breakfast. take HALF a tablet on Sundays  from now on and continue to take 1 tablet Monday through Saturday 30 tablet 4   No current facility-administered medications on file prior to visit.    ALLERGIES: No Known Allergies  FAMILY HISTORY: Family History  Problem Relation Age of Onset   Hypertension Mother    Multiple sclerosis Maternal Aunt    Breast cancer Maternal Aunt    Dementia Maternal Grandmother    Parkinsonism Maternal Grandfather       Objective:  *** General: No acute distress.  Patient appears ***-groomed.   Head:  Normocephalic/atraumatic Eyes:  Fundi examined but not visualized Neck: supple, no paraspinal tenderness, full range of motion Heart:  Regular rate and rhythm Neurological Exam: alert and oriented.  Speech fluent and not dysarthric, language intact.  CN II-XII intact. Bulk and tone normal, muscle strength 5/5 throughout.  Sensation to light touch intact.  Deep tendon reflexes 2+ throughout, toes downgoing.  Finger to nose testing intact.  Gait normal, Romberg negative.   Juliene Dunnings, DO  CC: ***

## 2023-11-16 ENCOUNTER — Ambulatory Visit: Payer: 59 | Admitting: Neurology

## 2023-11-18 NOTE — Progress Notes (Unsigned)
 NEUROLOGY FOLLOW UP OFFICE NOTE  [name] [id number]  Assessment/Plan:   Migraine without aura, without status migrainosus, not intractable - increased frequency   Migraine prevention:  Plan to change from Ajovy  to Emgality  Migraine rescue:  Will have her try samples of Ubrelvy 100mg .  Zofran  ODT 4mg  for nausea  Limit use of pain relievers to no more than 2 days out of week to prevent risk of rebound or medication-overuse headache. Keep headache diary Follow up 6 months.  Subjective:  Madinah Quarry is a 38 year old female with hypothyroidism, left sided hearing loss and hypertension who follows up for migraine.  UPDATE: Due to more frequent headaches, Ajovy  was changed to Emgality  in March.  She found out she was pregnant ***   She has had increased seasonal allergies and was previously having fluctuating high blood pressure but that has normalized.  Intensity:  10/10 Duration:  4-6 hours with Nurtec. Frequency:  2 to 3 migraines a week Current NSAIDS/analgesics:  Tylenol Current triptans:  none Current ergotamine:  none Current anti-emetic:  Zofran  4mg  Current muscle relaxants:  none Current Antihypertensive medications:  labetalol  Current Antidepressant medications:  none Current Anticonvulsant medications:  none Current anti-CGRP:  Emgality , Nurtec 75mg  PRN  Current Vitamins/Herbal/Supplements:  none Current Antihistamines/Decongestants:  loratadine  Other therapy:  none Birth control:  none Other medications:  levothyroxine , metformin     Caffeine:  1 cup coffee in morning.  Limits soda Diet:  Limits soda.  Increased water intake.  Does not skip meals. Exercise:  not routine.  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 Sleep hygiene:  okay.  Variable.  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  HISTORY:  Onset:  childhood Location:  mid-frontal and radiates to either/both side of front radiating to back of head and down neck into shoulders Quality:  pressure, throbbing Intensity:  10/10.   Aura:  absent Prodrome:  absent Associated symptoms:  nausea, photophobia, phonophobia, osmophobia, lightheadedness.  She denies associated vomiting, visual disturbance, unilateral numbness or weakness. Duration:  all day; sumatriptan  NS - 1-2 hours with residual pain for rest of day and returns at bedtime Frequency:  every day to every other day Frequency of abortive medication: every other day Triggers:  certain smells (cologne, perfume) Relieving factors:  rest Activity:  affects work   05/08/2016 CT HEAD WO:  Normal study She has left sided hearing loss since at least 2006.  07/09/2004 MRI BRAIN & TEMPORAL BONES W WO:  Negative examination.  It is genetic.   Past NSAIDS/analgesics:  ibuprofen Past abortive triptans:  sumatriptan  tab/NS Past abortive ergotamine:  none Past muscle relaxants:  none Past anti-emetic:  Zofran  Past antihypertensive medications:  propranolol , amlodipine  Past antidepressant medications:  duloxetine  Past anticonvulsant medications:  topiramate  Past anti-CGRP:  none Past vitamins/Herbal/Supplements:  none Past antihistamines/decongestants:  Benadryl Other past therapies:  none    Family history of headache:  no  PAST MEDICAL HISTORY: Past Medical History:  Diagnosis Date   Allergic rhinitis    Hx of migraines    Lower back pain    Thyroid  disease     MEDICATIONS: Current Outpatient Medications on File Prior to Visit  Medication Sig Dispense Refill   labetalol  (NORMODYNE ) 100 MG  tablet Take 1.5 tablets (150 mg total) by mouth 2 (two) times daily. 270 tablet 1   levothyroxine  (SYNTHROID ) 125 MCG tablet Take 1 tablet (125 mcg total) by mouth daily before breakfast. take HALF a tablet on Sundays  from now on and continue to take 1 tablet Monday through Saturday 30 tablet 4   No current facility-administered medications on file prior to visit.    ALLERGIES: No Known Allergies  FAMILY HISTORY: Family History  Problem Relation Age of Onset   Hypertension Mother    Multiple sclerosis Maternal Aunt    Breast cancer Maternal Aunt    Dementia Maternal Grandmother    Parkinsonism Maternal Grandfather       Objective:  *** General: No acute distress.  Patient appears ***-groomed.   Head:  Normocephalic/atraumatic Eyes:  Fundi examined but not visualized Neck: supple, no paraspinal tenderness, full range of motion Heart:  Regular rate and rhythm Neurological Exam: alert and oriented.  Speech fluent and not dysarthric, language intact.  CN II-XII intact. Bulk and tone normal, muscle strength 5/5 throughout.  Sensation to light touch intact.  Deep tendon reflexes 2+ throughout, toes downgoing.  Finger to nose testing intact.  Gait normal, Romberg negative.   Juliene Dunnings, DO  CC: ***

## 2023-11-20 ENCOUNTER — Encounter: Payer: Self-pay | Admitting: Neurology

## 2023-11-20 ENCOUNTER — Ambulatory Visit (INDEPENDENT_AMBULATORY_CARE_PROVIDER_SITE_OTHER): Admitting: Neurology

## 2023-11-20 VITALS — BP 164/92 | HR 88 | Ht 65.0 in | Wt 205.0 lb

## 2023-11-20 DIAGNOSIS — R519 Headache, unspecified: Secondary | ICD-10-CM

## 2023-11-20 DIAGNOSIS — I10 Essential (primary) hypertension: Secondary | ICD-10-CM | POA: Diagnosis not present

## 2023-11-20 MED ORDER — NURTEC 75 MG PO TBDP: 75.0000 mg | ORAL_TABLET | Freq: Every day | ORAL | 3 refills | Status: AC | PRN

## 2023-11-20 MED ORDER — EMGALITY 120 MG/ML ~~LOC~~ SOAJ: 240.0000 mg | Freq: Once | SUBCUTANEOUS | 0 refills | Status: AC

## 2023-11-20 MED ORDER — ONDANSETRON 4 MG PO TBDP: 4.0000 mg | ORAL_TABLET | Freq: Three times a day (TID) | ORAL | 5 refills | Status: AC | PRN

## 2023-11-20 NOTE — Patient Instructions (Addendum)
 Restart Emgality .  Contact me in 2 weeks to request sending in script for standing order.  If no improvement in 3 months, contact me Nurtec once daily as needed Zofran  for nausea

## 2023-12-07 ENCOUNTER — Ambulatory Visit: Payer: BC Managed Care – PPO | Admitting: Nurse Practitioner

## 2023-12-07 ENCOUNTER — Encounter: Payer: Self-pay | Admitting: Nurse Practitioner

## 2023-12-07 VITALS — BP 144/90 | HR 97 | Temp 97.3°F | Ht 66.0 in | Wt 203.0 lb

## 2023-12-07 DIAGNOSIS — Z0000 Encounter for general adult medical examination without abnormal findings: Secondary | ICD-10-CM

## 2023-12-07 DIAGNOSIS — I10 Essential (primary) hypertension: Secondary | ICD-10-CM

## 2023-12-07 DIAGNOSIS — E039 Hypothyroidism, unspecified: Secondary | ICD-10-CM

## 2023-12-07 DIAGNOSIS — Z23 Encounter for immunization: Secondary | ICD-10-CM

## 2023-12-07 MED ORDER — LABETALOL HCL 200 MG PO TABS: 200.0000 mg | ORAL_TABLET | Freq: Two times a day (BID) | ORAL | 3 refills | Status: AC

## 2023-12-07 NOTE — Assessment & Plan Note (Signed)
 TSH at goal. Continues on current synthroid  dose Followed by endocrine

## 2023-12-07 NOTE — Progress Notes (Signed)
 Careteam: Patient Care Team: Caro Harlene POUR, NP as PCP - General (Geriatric Medicine) Skeet Juliene SAUNDERS, DO as Consulting Physician (Neurology)  PLACE OF SERVICE:  [facility]  Advanced Directive information    No Known Allergies  Chief Complaint  Patient presents with   Annual Exam    Yearly physical. Flu and tetanus vaccine today. Patient will contact us  when she would like to consider covid booster.     HPI:  Discussed the use of AI scribe software for clinical note transcription with the patient, who gave verbal consent to proceed.  History of Present Illness Mariah Rocha is a 38 year old female who presents for an annual physical exam.  She is experiencing irregular menstrual cycles with heavy bleeding. After a miscarriage four months ago, she had one menstrual cycle in June but none until a recent induced cycle. She was prescribed medication to induce her cycle, which she took for twelve days, resulting in spotting followed by a full menstrual cycle.  She reports elevated blood pressure readings at home and is currently taking labetalol , one and a half tablets twice a day. She experiences occasional palpitations, which she attributes to stress from work and personal events. She reports eating on the go and not being able to follow a healthy diet, but has reduced soda intake and primarily drinks water.  Her family history is significant for breast cancer on both maternal and paternal sides, with her aunts diagnosed in their fifties. She is unable to maintain a regular exercise routine and reports fluctuating weight. No constipation and regular bowel movements.    Review of Systems:  Review of Systems  Constitutional:  Negative for chills, fever and weight loss.  HENT:  Negative for tinnitus.   Respiratory:  Negative for cough, sputum production and shortness of breath.   Cardiovascular:  Negative for chest pain, palpitations and leg swelling.  Gastrointestinal:   Negative for abdominal pain, constipation, diarrhea and heartburn.  Genitourinary:  Negative for dysuria, frequency and urgency.  Musculoskeletal:  Negative for back pain, falls, joint pain and myalgias.  Skin: Negative.   Neurological:  Negative for dizziness and headaches.  Psychiatric/Behavioral:  Negative for depression and memory loss. The patient does not have insomnia.     Past Medical History:  Diagnosis Date   Allergic rhinitis    Hx of migraines    Lower back pain    Thyroid  disease    Past Surgical History:  Procedure Laterality Date   HERNIA REPAIR  1989    Dr.Kathleen Duwaine    Social History:   reports that she quit smoking about 4 years ago. Her smoking use included cigarettes. She started smoking about 6 years ago. She has never used smokeless tobacco. She reports that she does not currently use alcohol. She reports that she does not currently use drugs after having used the following drugs: Marijuana.  Family History  Problem Relation Age of Onset   Hypertension Mother    Multiple sclerosis Maternal Aunt    Breast cancer Maternal Aunt    Dementia Maternal Grandmother    Parkinsonism Maternal Grandfather     Medications: Patient's Medications  New Prescriptions   No medications on file  Previous Medications   LABETALOL  (NORMODYNE ) 100 MG TABLET    Take 1.5 tablets (150 mg total) by mouth 2 (two) times daily.   LEVOTHYROXINE  (SYNTHROID ) 125 MCG TABLET    Take 1 tablet (125 mcg total) by mouth daily before breakfast. take HALF a  tablet on Sundays from now on and continue to take 1 tablet Monday through Saturday   ONDANSETRON  (ZOFRAN -ODT) 4 MG DISINTEGRATING TABLET    Take 1 tablet (4 mg total) by mouth every 8 (eight) hours as needed for nausea or vomiting.   RIMEGEPANT SULFATE (NURTEC) 75 MG TBDP    Take 1 tablet (75 mg total) by mouth daily as needed (migraine).  Modified Medications   No medications on file  Discontinued Medications   No medications on file     Physical Exam:  Vitals:   12/07/23 1325 12/07/23 1354  BP: (!) 146/92 (!) 144/90  Pulse: 97   Temp: (!) 97.3 F (36.3 C)   TempSrc: Temporal   SpO2: 99%   Weight: 203 lb (92.1 kg)   Height: 5' 6 (1.676 m)    Body mass index is 32.77 kg/m. Wt Readings from Last 3 Encounters:  12/07/23 203 lb (92.1 kg)  11/20/23 205 lb (93 kg)  07/03/23 203 lb 3.2 oz (92.2 kg)    Physical Exam Constitutional:      General: She is not in acute distress.    Appearance: She is well-developed. She is not diaphoretic.  HENT:     Head: Normocephalic and atraumatic.     Mouth/Throat:     Pharynx: No oropharyngeal exudate.  Eyes:     Conjunctiva/sclera: Conjunctivae normal.     Pupils: Pupils are equal, round, and reactive to light.  Cardiovascular:     Rate and Rhythm: Normal rate and regular rhythm.     Heart sounds: Normal heart sounds.  Pulmonary:     Effort: Pulmonary effort is normal.     Breath sounds: Normal breath sounds.  Abdominal:     General: Bowel sounds are normal.     Palpations: Abdomen is soft.  Musculoskeletal:     Cervical back: Normal range of motion and neck supple.     Right lower leg: No edema.     Left lower leg: No edema.  Skin:    General: Skin is warm and dry.  Neurological:     Mental Status: She is alert.  Psychiatric:        Mood and Affect: Mood normal.     Labs reviewed: Basic Metabolic Panel: Recent Labs    07/02/23 0837  TSH 3.50   Liver Function Tests: No results for input(s): AST, ALT, ALKPHOS, BILITOT, PROT, ALBUMIN in the last 8760 hours. No results for input(s): LIPASE, AMYLASE in the last 8760 hours. No results for input(s): AMMONIA in the last 8760 hours. CBC: No results for input(s): WBC, NEUTROABS, HGB, HCT, MCV, PLT in the last 8760 hours. Lipid Panel: No results for input(s): CHOL, HDL, LDLCALC, TRIG, CHOLHDL, LDLDIRECT in the last 8760 hours. TSH: Recent Labs    07/02/23 0837   TSH 3.50   A1C: No results found for: HGBA1C   Assessment/Plan  Preventative health care -     Comprehensive metabolic panel with GFR -     Lipid panel -     CBC with Differential/Platelet wellness visit completed today, The patient was counseled regarding the appropriate use of alcohol, regular self-examination of the breasts on a monthly basis, prevention of dental and periodontal disease, diet, regular sustained exercise for at least 30 minutes 5 times per week, routine screening interval for mammogram as recommended by the American Cancer Society at 40, and ACOG, importance of regular PAP smears, tobacco use, and recommended schedule for GI hemoccult testing, colonoscopy, cholesterol, thyroid  and diabetes  screening.  Acquired hypothyroidism Assessment & Plan: TSH at goal. Continues on current synthroid  dose Followed by endocrine   Essential hypertension Assessment & Plan: Elevated, will increase labetalol  to 200 mg by mouth twice daily Continue dietary modifications.  -To check blood pressure 1 hour AFTER you have had your medication Make sure you have been sitting at least 5 mins  Record and let us  know.  Goal <140/90- send via mychart for review.    Orders: -     Comprehensive metabolic panel with GFR -     CBC with Differential/Platelet -     Labetalol  HCl; Take 1 tablet (200 mg total) by mouth 2 (two) times daily.  Dispense: 60 tablet; Refill: 3  Immunization due -     Flu vaccine trivalent PF, 6mos and older(Flulaval,Afluria,Fluarix,Fluzone) -     Tdap vaccine greater than or equal to 7yo IM   Return in about 6 months (around 06/05/2024) for routine follow up.  Cordelle Dahmen K. Caro BODILY [facility] [phone]

## 2023-12-07 NOTE — Assessment & Plan Note (Signed)
 Elevated, will increase labetalol  to 200 mg by mouth twice daily Continue dietary modifications.

## 2023-12-08 ENCOUNTER — Other Ambulatory Visit (HOSPITAL_COMMUNITY): Payer: Self-pay

## 2023-12-08 ENCOUNTER — Ambulatory Visit: Payer: Self-pay | Admitting: Nurse Practitioner

## 2023-12-08 LAB — CBC WITH DIFFERENTIAL/PLATELET
Absolute Lymphocytes: 2141 {cells}/uL (ref 850–3900)
Absolute Monocytes: 424 {cells}/uL (ref 200–950)
Basophils Absolute: 21 {cells}/uL (ref 0–200)
Basophils Relative: 0.4 %
Eosinophils Absolute: 80 {cells}/uL (ref 15–500)
Eosinophils Relative: 1.5 %
HCT: 35 % (ref 35.0–45.0)
Hemoglobin: 11.4 g/dL — ABNORMAL LOW (ref 11.7–15.5)
MCH: 29.2 pg (ref 27.0–33.0)
MCHC: 32.6 g/dL (ref 32.0–36.0)
MCV: 89.7 fL (ref 80.0–100.0)
MPV: 10.8 fL (ref 7.5–12.5)
Monocytes Relative: 8 %
Neutro Abs: 2634 {cells}/uL (ref 1500–7800)
Neutrophils Relative %: 49.7 %
Platelets: 324 Thousand/uL (ref 140–400)
RBC: 3.9 Million/uL (ref 3.80–5.10)
RDW: 13.6 % (ref 11.0–15.0)
Total Lymphocyte: 40.4 %
WBC: 5.3 Thousand/uL (ref 3.8–10.8)

## 2023-12-08 LAB — COMPREHENSIVE METABOLIC PANEL WITH GFR
AG Ratio: 1.6 (calc) (ref 1.0–2.5)
ALT: 19 U/L (ref 6–29)
AST: 20 U/L (ref 10–30)
Albumin: 4.5 g/dL (ref 3.6–5.1)
Alkaline phosphatase (APISO): 49 U/L (ref 31–125)
BUN: 11 mg/dL (ref 7–25)
CO2: 28 mmol/L (ref 20–32)
Calcium: 9.1 mg/dL (ref 8.6–10.2)
Chloride: 105 mmol/L (ref 98–110)
Creat: 0.88 mg/dL (ref 0.50–0.97)
Globulin: 2.8 g/dL (ref 1.9–3.7)
Glucose, Bld: 81 mg/dL (ref 65–139)
Potassium: 3.9 mmol/L (ref 3.5–5.3)
Sodium: 140 mmol/L (ref 135–146)
Total Bilirubin: 0.4 mg/dL (ref 0.2–1.2)
Total Protein: 7.3 g/dL (ref 6.1–8.1)
eGFR: 87 mL/min/1.73m2 (ref >=60)

## 2023-12-08 LAB — LIPID PANEL
Cholesterol: 194 mg/dL (ref <200)
HDL: 56 mg/dL (ref >=50)
LDL Cholesterol (Calc): 111 mg/dL — ABNORMAL HIGH
Non-HDL Cholesterol (Calc): 138 mg/dL — ABNORMAL HIGH (ref <130)
Total CHOL/HDL Ratio: 3.5 (calc) (ref <5.0)
Triglycerides: 154 mg/dL — ABNORMAL HIGH (ref <150)

## 2023-12-21 ENCOUNTER — Encounter: Payer: Self-pay | Admitting: Nurse Practitioner

## 2023-12-21 NOTE — Telephone Encounter (Signed)
 MyChart message sent to patient.

## 2023-12-22 ENCOUNTER — Encounter: Payer: Self-pay | Admitting: Family

## 2023-12-22 ENCOUNTER — Ambulatory Visit: Admitting: Family

## 2023-12-22 ENCOUNTER — Ambulatory Visit: Payer: Self-pay

## 2023-12-22 VITALS — BP 136/88 | HR 97 | Temp 97.6°F | Resp 20 | Ht 66.0 in | Wt 206.0 lb

## 2023-12-22 DIAGNOSIS — R300 Dysuria: Secondary | ICD-10-CM | POA: Diagnosis not present

## 2023-12-22 LAB — POCT URINALYSIS DIPSTICK
Bilirubin, UA: NEGATIVE
Glucose, UA: NEGATIVE
Ketones, UA: NEGATIVE
Nitrite, UA: NEGATIVE
Protein, UA: POSITIVE — AB
Spec Grav, UA: 1.015 (ref 1.010–1.025)
Urobilinogen, UA: 0.2 U/dL
pH, UA: 7.5 (ref 5.0–8.0)

## 2023-12-22 MED ORDER — CIPROFLOXACIN HCL 500 MG PO TABS: 500.0000 mg | ORAL_TABLET | Freq: Two times a day (BID) | ORAL | 0 refills | Status: AC

## 2023-12-22 NOTE — Telephone Encounter (Signed)
 Message routed to Roxan Plough, NP and Tax inspector.W/CMA as RICK. Patient has appointment today 12/22/2023 at [facility]

## 2023-12-22 NOTE — Progress Notes (Signed)
 Provider: Deriyah Kunath FNP-C  Caro Harlene POUR, NP  Patient Care Team: Caro Harlene POUR, NP as PCP - General (Geriatric Medicine) Skeet Juliene SAUNDERS, DO as Consulting Physician (Neurology)  Extended Emergency Contact Information Primary Emergency Contact: Sanzone,[name] Address: [address]          [place], [place] [address] [place] Home Phone: [phone] Work Phone: [phone] Relation: Mother Secondary Emergency Contact: Douglas,Marques Address: [address]          [place], [place] [address] [place] Mobile Phone: [phone] Relation: Significant other  Code Status:  Full Code  Goals of care: Advanced Directive information    12/22/2023    8:53 AM  Advanced Directives  Does Patient Have a Medical Advance Directive? No  Would patient like information on creating a medical advance directive? No - Patient declined     Chief Complaint  Patient presents with   UTI symptoms    Discussed the use of AI scribe software for clinical note transcription with the patient, who gave verbal consent to proceed.  History of Present Illness   Mariah Rocha is a 38 year old female who presents with abdominal pain and urinary symptoms.  She has been experiencing abdominal pain and a 'weird sensation' for four days. This morning, she felt nauseous but did not vomit. No fever or chills. Her appetite remains good.  She has a history of urinary tract infections but hasn't had one in a long time. She drinks water throughout the day but acknowledges it may not be sufficient.  Her last menstrual period started on August 30th and ended on September 6th. She had a miscarriage four months ago and is not currently using birth control pills.    Past Medical History:  Diagnosis Date   Allergic rhinitis    Hx of migraines    Lower back pain    Thyroid  disease    Past Surgical History:  Procedure Laterality Date   HERNIA REPAIR  1989     Dr.Kathleen Lucas     No Known Allergies  Outpatient Encounter Medications as of 12/22/2023  Medication Sig   ciprofloxacin  (CIPRO ) 500 MG tablet Take 1 tablet (500 mg total) by mouth 2 (two) times daily for 7 days.   labetalol  (NORMODYNE ) 200 MG tablet Take 1 tablet (200 mg total) by mouth 2 (two) times daily.   levothyroxine  (SYNTHROID ) 125 MCG tablet Take 1 tablet (125 mcg total) by mouth daily before breakfast. take HALF a tablet on Sundays from now on and continue to take 1 tablet Monday through Saturday   ondansetron  (ZOFRAN -ODT) 4 MG disintegrating tablet Take 1 tablet (4 mg total) by mouth every 8 (eight) hours as needed for nausea or vomiting.   Rimegepant Sulfate (NURTEC) 75 MG TBDP Take 1 tablet (75 mg total) by mouth daily as needed (migraine).   No facility-administered encounter medications on file as of 12/22/2023.    Review of Systems  Constitutional:  Negative for appetite change, chills, fatigue, fever and unexpected weight change.  Eyes:  Negative for pain, discharge, redness, itching and visual disturbance.  Respiratory:  Negative for cough, chest tightness, shortness of breath and wheezing.   Cardiovascular:  Negative for chest pain, palpitations and leg swelling.  Gastrointestinal:  Positive for abdominal pain and nausea. Negative for abdominal distention, blood in stool, constipation, diarrhea and vomiting.  Genitourinary:  Positive for dysuria. Negative for difficulty urinating, flank pain, frequency and urgency.  Musculoskeletal:  Negative  for arthralgias, back pain, gait problem, joint swelling and myalgias.  Skin:  Negative for color change, pallor and rash.    Immunization History  Administered Date(s) Administered   DTP 08/29/1987, 11/02/1987, 01/31/1988, 09/23/1988, 11/17/1991   Dtap, Unspecified 08/29/1987, 11/02/1987, 01/31/1988, 09/23/1988, 11/17/1991   HIB, Unspecified 12/15/1988   HPV Quadrivalent 06/06/2005, 08/07/2005, 12/11/2005   Hep A,  Unspecified 06/06/2005   Hep B, Unspecified 01/02/1999, 02/12/1999, 07/16/1999   Hepatitis A, Ped/Adol-2 Dose 12/11/2005   Influenza, Seasonal, Injecte, Preservative Fre 12/07/2023   Influenza,inj,Quad PF,6+ Mos 01/12/2013, 02/22/2014, 04/03/2015, 04/03/2016, 02/14/2019, 03/02/2020, 03/04/2021   Influenza-Unspecified 12/25/2011   MMR 09/23/1988, 11/17/1991   Meningococcal Conjugate 07/11/2005   PFIZER(Purple Top)SARS-COV-2 Vaccination 06/30/2019, 07/25/2019, 03/10/2020   PPD Test 12/29/2011, 09/16/2019   Polio, Unspecified 08/29/1987, 11/02/1987, 01/31/1988, 09/23/1988, 11/17/1991   Td 11/24/2000   Tdap 01/12/2013, 12/07/2023   Pertinent  Health Maintenance Due  Topic Date Due   Influenza Vaccine  Completed      11/21/2022    9:34 AM 04/28/2023    3:04 PM 05/04/2023    3:29 PM 11/20/2023    7:59 AM 12/07/2023    1:49 PM  Fall Risk  Falls in the past year? 0 1 0 0 0  Was there an injury with Fall? 0 0 0 0 0  Fall Risk Category Calculator 0 1 0 0 0  Patient at Risk for Falls Due to No Fall Risks Other (Comment) No Fall Risks  No Fall Risks  Fall risk Follow up Falls evaluation completed Falls evaluation completed;Education provided;Falls prevention discussed Falls evaluation completed Falls evaluation completed Falls evaluation completed   Functional Status Survey:    Vitals:   12/22/23 0854  BP: 136/88  Pulse: 97  Resp: 20  Temp: 97.6 F (36.4 C)  SpO2: 98%  Weight: 206 lb (93.4 kg)  Height: 5' 6 (1.676 m)   Body mass index is 33.25 kg/m. Physical Exam  VITALS: T- 97.6, P- 97, BP- 136/88, SaO2- 98% MEASUREMENTS: Weight- 206. GENERAL: Alert, cooperative, well developed, no acute distress HEENT: Normocephalic, normal oropharynx, moist mucous membranes CHEST: Clear to auscultation bilaterally, no wheezes, rhonchi, or crackles CARDIOVASCULAR: Normal heart rate and rhythm, S1 and S2 normal without murmurs ABDOMEN: Tender to palpation, non-distended, without organomegaly,  normal bowel sounds EXTREMITIES: Normal, no cyanosis or edema NEUROLOGICAL: Cranial nerves grossly intact, moves all extremities without gross motor or sensory deficit   Labs reviewed: Recent Labs    12/07/23 1419  NA 140  K 3.9  CL 105  CO2 28  GLUCOSE 81  BUN 11  CREATININE 0.88  CALCIUM 9.1   Recent Labs    12/07/23 1419  AST 20  ALT 19  BILITOT 0.4  PROT 7.3   Recent Labs    12/07/23 1419  WBC 5.3  NEUTROABS 2,634  HGB 11.4*  HCT 35.0  MCV 89.7  PLT 324   Lab Results  Component Value Date   TSH 3.50 07/02/2023   No results found for: HGBA1C Lab Results  Component Value Date   CHOL 194 12/07/2023   HDL 56 12/07/2023   LDLCALC 111 (H) 12/07/2023   TRIG 154 (H) 12/07/2023   CHOLHDL 3.5 12/07/2023    Significant Diagnostic Results in last 30 days:  No results found.  Assessment/Plan    Urinary tract infection Symptoms since Saturday, including abdominal pain and nausea. Urinalysis shows clear urine with protein, moderate leukocytes, and blood, but negative nitrites. - Send urine for culture - Prescribe Ciprofloxacin  500 mg,  one tablet in the morning and one in the evening - Advise to complete the antibiotic course even if symptoms improve to prevent resistance - Recommend increasing water intake to help flush the infection - Suggest taking probiotics or eating yogurt to prevent yeast infection   Family/ staff Communication: Reviewed plan of care with patient verbalized understanding   Labs/tests ordered:  - Urine Culture; Future - POC Urinalysis Dipstick  Next Appointment: Return if symptoms worsen or fail to improve.   Total time: 20 minutes. Greater than 50% of total time spent doing patient education regarding Dysuria,health maintenance including symptom/medication management.   Roxan JAYSON Plough, NP

## 2023-12-22 NOTE — Telephone Encounter (Signed)
 FYI Only or Action Required?: Action required by provider: request for appointment.  Patient was last seen in primary care on 12/07/2023 by Caro Harlene POUR, NP.  Called Nurse Triage reporting Urinary Tract Infection.  Symptoms began several days ago.  Interventions attempted: OTC medications: AZO.  Symptoms are: gradually worsening.  Triage Disposition: See HCP Within 4 Hours (Or PCP Triage)  Patient/caregiver understands and will follow disposition?: YesCopied from CRM #[id number]. Topic: Clinical - Red Word Triage >> Dec 22, 2023  8:20 AM Mariah Rocha wrote: Red Word that prompted transfer to Nurse Triage: Patient wanting to schedule appt for today if there is any. States she thinks she may have a UTI. Having a weird feeling after urinating & a little bit of abdominal pain. Reason for Disposition  Side (flank) or lower back pain present  Answer Assessment - Initial Assessment Questions Pt has tried AZO for last 3 days.      1. SYMPTOM: What's the main symptom you're concerned about? (e.g., frequency, incontinence)     Frequency  2. ONSET: When did the    start?     Saturday  3. PAIN: Is there any pain? If Yes, ask: How bad is it? (Scale: 1-10; mild, moderate, severe)     6 4. CAUSE: What do you think is causing the symptoms?     UTI 5. OTHER SYMPTOMS: Do you have any other symptoms? (e.g., blood in urine, fever, flank pain, pain with urination)     Abd pain  Protocols used: Urinary Symptoms-A-AH

## 2023-12-23 ENCOUNTER — Encounter: Payer: Self-pay | Admitting: Neurology

## 2023-12-23 MED ORDER — EMGALITY 120 MG/ML ~~LOC~~ SOAJ: 120.0000 mg | SUBCUTANEOUS | 11 refills | Status: AC

## 2023-12-24 LAB — URINE CULTURE
MICRO NUMBER:: 17006065
SPECIMEN QUALITY:: ADEQUATE

## 2023-12-25 ENCOUNTER — Telehealth: Payer: Self-pay | Admitting: Pharmacy Technician

## 2023-12-25 ENCOUNTER — Ambulatory Visit: Payer: Self-pay | Admitting: Family

## 2023-12-25 NOTE — Telephone Encounter (Signed)
 Pharmacy Patient Advocate Encounter   Received notification from Fax that prior authorization for EMGALITY  120MG  is required/requested.   Insurance verification completed.   The patient is insured through [facility] .   Per test claim: PA required; PA submitted to above mentioned insurance via Latent Key/confirmation #/EOC [id number] Status is pending

## 2023-12-31 ENCOUNTER — Other Ambulatory Visit (HOSPITAL_COMMUNITY): Payer: Self-pay

## 2024-01-01 ENCOUNTER — Other Ambulatory Visit (HOSPITAL_COMMUNITY): Payer: Self-pay

## 2024-01-01 NOTE — Telephone Encounter (Signed)
 Pharmacy Patient Advocate Encounter  Received notification from [facility] that Prior Authorization for EMGALITY  120MG  has been APPROVED from 10.3.25 to 10.2.26. Ran test claim, Copay is $35. This test claim was processed through [facility]- copay amounts may vary at other pharmacies due to pharmacy/plan contracts, or as the patient moves through the different stages of their insurance plan.   PA #/Case ID/Reference #: [id number]

## 2024-01-13 ENCOUNTER — Other Ambulatory Visit: Payer: Self-pay | Admitting: Internal Medicine

## 2024-01-14 ENCOUNTER — Encounter: Payer: Self-pay | Admitting: Family

## 2024-01-14 ENCOUNTER — Ambulatory Visit: Admitting: Family

## 2024-01-14 ENCOUNTER — Ambulatory Visit: Payer: Self-pay

## 2024-01-14 ENCOUNTER — Encounter: Payer: Self-pay | Admitting: Nurse Practitioner

## 2024-01-14 VITALS — BP 140/88 | HR 93 | Temp 97.6°F | Resp 20 | Ht 66.0 in | Wt 205.2 lb

## 2024-01-14 DIAGNOSIS — G43109 Migraine with aura, not intractable, without status migrainosus: Secondary | ICD-10-CM | POA: Diagnosis not present

## 2024-01-14 DIAGNOSIS — I10 Essential (primary) hypertension: Secondary | ICD-10-CM | POA: Diagnosis not present

## 2024-01-14 DIAGNOSIS — E039 Hypothyroidism, unspecified: Secondary | ICD-10-CM

## 2024-01-14 MED ORDER — AMLODIPINE BESYLATE 5 MG PO TABS: 5.0000 mg | ORAL_TABLET | Freq: Every day | ORAL | 1 refills | Status: DC

## 2024-01-14 NOTE — Telephone Encounter (Signed)
Message routed to Eubanks, Jessica K, NP  

## 2024-01-14 NOTE — Telephone Encounter (Signed)
 Noted, glad she is getting into see Dinah

## 2024-01-14 NOTE — Telephone Encounter (Signed)
 FYI Only or Action Required?: Action required by provider: request for appointment.  Patient was last seen in primary care on 12/22/2023 by Mariah Rocha, Mariah BROCKS, NP.  Called Nurse Triage reporting Headache.  Symptoms began several days ago.  Interventions attempted: Prescription medications: Nurtec.  Symptoms are: unchanged. Migraine continues with elevated BP  149/101  155/99  Triage Disposition: See Physician Within 24 Hours  Patient/caregiver understands and will follow disposition?: Yes     Copied from CRM #[id number]. Topic: Clinical - Red Word Triage >> Jan 14, 2024  9:09 AM Susanna ORN wrote: Red Word that prompted transfer to Nurse Triage: Patient wants to see if appts is available for today with her PCP. States she's been having headaches with throbbing pains. States she's having neck & shoulder pains, and throbbing pains in the front & back of her head. States her blood pressure has also been high. Last night's reading was 149/101 and this morning's reading is 155/99. Answer Assessment - Initial Assessment Questions 1. LOCATION: Where does it hurt?      front 2. ONSET: When did the headache start? (e.g., minutes, hours, days)      Last week 3. PATTERN: Does the pain come and go, or has it been constant since it started?     Comes and goes 4. SEVERITY: How bad is the pain? and What does it keep you from doing?  (e.g., Scale 1-10; mild, moderate, or severe)     severe 5. RECURRENT SYMPTOM: Have you ever had headaches before? If Yes, ask: When was the last time? and What happened that time?      yes 6. CAUSE: What do you think is causing the headache?     Maybe BP 7. MIGRAINE: Have you been diagnosed with migraine headaches? If Yes, ask: Is this headache similar?      yes 8. HEAD INJURY: Has there been any recent injury to your head?      NO 9. OTHER SYMPTOMS: Do you have any other symptoms? (e.g., fever, stiff neck, eye pain, sore throat, cold  symptoms)     Neck pain 10. PREGNANCY: Is there any chance you are pregnant? When was your last menstrual period?       no  Protocols used: Headache-A-AH  Reason for Disposition  [1] MODERATE headache (e.g., interferes with normal activities) AND [2] present > 24 hours AND [3] unexplained  (Exceptions: Pain medicines not tried, typical migraine, or headache part of viral illness.)  Answer Assessment - Initial Assessment Questions 1. LOCATION: Where does it hurt?      front 2. ONSET: When did the headache start? (e.g., minutes, hours, days)      Last week 3. PATTERN: Does the pain come and go, or has it been constant since it started?     Comes and goes 4. SEVERITY: How bad is the pain? and What does it keep you from doing?  (e.g., Scale 1-10; mild, moderate, or severe)     severe 5. RECURRENT SYMPTOM: Have you ever had headaches before? If Yes, ask: When was the last time? and What happened that time?      yes 6. CAUSE: What do you think is causing the headache?     Maybe BP 7. MIGRAINE: Have you been diagnosed with migraine headaches? If Yes, ask: Is this headache similar?      yes 8. HEAD INJURY: Has there been any recent injury to your head?      NO 9. OTHER SYMPTOMS: Do you  have any other symptoms? (e.g., fever, stiff neck, eye pain, sore throat, cold symptoms)     Neck pain 10. PREGNANCY: Is there any chance you are pregnant? When was your last menstrual period?       no  Protocols used: Headache-A-AH

## 2024-01-14 NOTE — Telephone Encounter (Signed)
 See Triage Notes from Triage Nurse FYI patient has an appointment to be seen in office this morning.

## 2024-01-25 NOTE — Progress Notes (Signed)
 Provider: Kyla Duffy FNP-C  Caro Harlene POUR, NP  Patient Care Team: Caro Harlene POUR, NP as PCP - General (Geriatric Medicine) Skeet Juliene SAUNDERS, DO as Consulting Physician (Neurology)  Extended Emergency Contact Information Primary Emergency Contact: Klosinski,[name] Address: [address]          [place], [place] [address] [place] Home Phone: [phone] Work Phone: [phone] Relation: Mother Secondary Emergency Contact: Douglas,Marques Address: [address]          [place], [place] [address] [place] Phone: [phone] Relation: Significant other  Code Status: Full code Goals of care: Advanced Directive information    12/22/2023    8:53 AM  Advanced Directives  Does Patient Have a Medical Advance Directive? No  Would patient like information on creating a medical advance directive? No - Patient declined     Chief Complaint  Patient presents with   Migraine    High blood pressure.     Discussed the use of AI scribe software for clinical note transcription with the patient, who gave verbal consent to proceed.  History of Present Illness   Mariah Rocha is a 38 year old female with migraines and hypertension who presents with worsening migraine symptoms and elevated blood pressure.  She has a history of migraines, which have recently worsened. Severe headaches began on Monday, initially localized to the front of her head, then spreading to the back of her head, neck, and shoulders. By Tuesday, she experienced diarrhea in addition to the headache, neck, and shoulder pain, leading her to stay home from work. On Wednesday, the pain intensified, affecting her eyes, face, and the back of her head, accompanied by lightheadedness and nausea. Usual medications, including Nurtec 75 mg as needed and Zofran  for nausea, were ineffective in alleviating her symptoms. She is awaiting a monthly injection from her neurologist, which  is pending processing.  She reports elevated blood pressure readings at home, with measurements of 155/101, 150/99, and 155/100 over the past two days. She is currently taking labetalol  twice daily for hypertension. No recent changes in stress levels and no new allergies to medications. She also mentions experiencing palpitations but no chest pain.  Her past medical history includes hypothyroidism, for which she takes levothyroxine  125 mcg daily. She reports a last menstrual period from August 30th to September 5th and does not use contraceptives. She drinks a lot of water and occasionally tea, avoiding sodas. Her recent lab work in September showed a hemoglobin level of 11.4, with normal kidney function, electrolytes, and liver enzymes, but elevated triglycerides and LDL cholesterol.   Past Medical History:  Diagnosis Date   Allergic rhinitis    Hx of migraines    Lower back pain    Thyroid  disease    Past Surgical History:  Procedure Laterality Date   HERNIA REPAIR  1989    Dr.Kathleen Lucas     No Known Allergies  Outpatient Encounter Medications as of 01/14/2024  Medication Sig   amLODipine  (NORVASC ) 5 MG tablet Take 1 tablet (5 mg total) by mouth daily.   Galcanezumab -gnlm (EMGALITY ) 120 MG/ML SOAJ Inject 120 mg into the skin every 28 (twenty-eight) days.   labetalol  (NORMODYNE ) 200 MG tablet Take 1 tablet (200 mg total) by mouth 2 (two) times daily.   levothyroxine  (SYNTHROID ) 125 MCG tablet take HALF a tablet on Sundays from now on and continue to take 1 tablet Monday through Saturday   ondansetron  (ZOFRAN -ODT) 4 MG disintegrating  tablet Take 1 tablet (4 mg total) by mouth every 8 (eight) hours as needed for nausea or vomiting.   Rimegepant Sulfate (NURTEC) 75 MG TBDP Take 1 tablet (75 mg total) by mouth daily as needed (migraine).   No facility-administered encounter medications on file as of 01/14/2024.    Review of Systems  Constitutional:  Negative for appetite change,  chills, fatigue, fever and unexpected weight change.  HENT:  Negative for congestion, dental problem, ear discharge, ear pain, facial swelling, hearing loss, nosebleeds, postnasal drip, rhinorrhea, sinus pressure, sinus pain, sneezing, sore throat, tinnitus and trouble swallowing.   Eyes:  Negative for pain, discharge, redness, itching and visual disturbance.  Respiratory:  Negative for cough, chest tightness, shortness of breath and wheezing.   Cardiovascular:  Negative for chest pain, palpitations and leg swelling.  Gastrointestinal:  Negative for abdominal distention, abdominal pain, blood in stool, constipation, diarrhea, nausea and vomiting.  Endocrine: Negative for cold intolerance, heat intolerance, polydipsia, polyphagia and polyuria.  Genitourinary:  Negative for difficulty urinating, dysuria, flank pain, frequency and urgency.  Musculoskeletal:  Negative for arthralgias, back pain, gait problem, joint swelling, myalgias, neck pain and neck stiffness.  Skin:  Negative for color change, pallor, rash and wound.  Neurological:  Negative for dizziness, syncope, speech difficulty, weakness, light-headedness and numbness.       Migraine headache has worsen   Hematological:  Does not bruise/bleed easily.  Psychiatric/Behavioral:  Negative for agitation, behavioral problems, confusion, hallucinations, self-injury, sleep disturbance and suicidal ideas. The patient is not nervous/anxious.     Immunization History  Administered Date(s) Administered   DTP 08/29/1987, 11/02/1987, 01/31/1988, 09/23/1988, 11/17/1991   Dtap, Unspecified 08/29/1987, 11/02/1987, 01/31/1988, 09/23/1988, 11/17/1991   HIB, Unspecified 12/15/1988   HPV Quadrivalent 06/06/2005, 08/07/2005, 12/11/2005   Hep A, Unspecified 06/06/2005   Hep B, Unspecified 01/02/1999, 02/12/1999, 07/16/1999   Hepatitis A, Ped/Adol-2 Dose 12/11/2005   Influenza, Seasonal, Injecte, Preservative Fre 12/07/2023   Influenza,inj,Quad PF,6+ Mos  01/12/2013, 02/22/2014, 04/03/2015, 04/03/2016, 02/14/2019, 03/02/2020, 03/04/2021   Influenza-Unspecified 12/25/2011   MMR 09/23/1988, 11/17/1991   Meningococcal Conjugate 07/11/2005   PFIZER(Purple Top)SARS-COV-2 Vaccination 06/30/2019, 07/25/2019, 03/10/2020   PPD Test 12/29/2011, 09/16/2019   Polio, Unspecified 08/29/1987, 11/02/1987, 01/31/1988, 09/23/1988, 11/17/1991   Td 11/24/2000   Tdap 01/12/2013, 12/07/2023   Pertinent  Health Maintenance Due  Topic Date Due   Influenza Vaccine  Completed      11/21/2022    9:34 AM 04/28/2023    3:04 PM 05/04/2023    3:29 PM 11/20/2023    7:59 AM 12/07/2023    1:49 PM  Fall Risk  Falls in the past year? 0 1 0 0 0  Was there an injury with Fall? 0 0 0 0 0  Fall Risk Category Calculator 0 1 0 0 0  Patient at Risk for Falls Due to No Fall Risks Other (Comment) No Fall Risks  No Fall Risks  Fall risk Follow up Falls evaluation completed Falls evaluation completed;Education provided;Falls prevention discussed Falls evaluation completed Falls evaluation completed Falls evaluation completed   Functional Status Survey:    Vitals:   01/14/24 0950 01/14/24 1022  BP: (!) 140/90 (!) 140/88  Pulse: 93   Resp: 20   Temp: 97.6 F (36.4 C)   SpO2: 98%   Weight: 205 lb 3.2 oz (93.1 kg)   Height: 5' 6 (1.676 m)    Body mass index is 33.12 kg/m. Physical Exam  VITALS: T- 97.6, P- 93, BP- 140/88, SaO2- 98% MEASUREMENTS: Weight- 206.  GENERAL: Alert, cooperative, well developed, no acute distress. HEENT: Normocephalic, normal oropharynx, moist mucous membranes, tenderness on the forehead. CHEST: Clear to auscultation bilaterally, no wheezes, rhonchi, or crackles. CARDIOVASCULAR: Normal heart rate and rhythm, S1 and S2 normal without murmurs. ABDOMEN: Soft, tenderness present, non-distended, without organomegaly, normal bowel sounds. EXTREMITIES: No cyanosis, edema, or swelling in the legs. NEUROLOGICAL: Cranial nerves grossly intact, moves all  extremities without gross motor or sensory deficit.  SKIN: No rash,no lesion or erythema   PSYCHIATRY/BEHAVIORAL: Mood stable    Labs reviewed: Recent Labs    12/07/23 1419  NA 140  K 3.9  CL 105  CO2 28  GLUCOSE 81  BUN 11  CREATININE 0.88  CALCIUM 9.1   Recent Labs    12/07/23 1419  AST 20  ALT 19  BILITOT 0.4  PROT 7.3   Recent Labs    12/07/23 1419  WBC 5.3  NEUTROABS 2,634  HGB 11.4*  HCT 35.0  MCV 89.7  PLT 324   Lab Results  Component Value Date   TSH 3.50 07/02/2023   No results found for: HGBA1C Lab Results  Component Value Date   CHOL 194 12/07/2023   HDL 56 12/07/2023   LDLCALC 111 (H) 12/07/2023   TRIG 154 (H) 12/07/2023   CHOLHDL 3.5 12/07/2023    Significant Diagnostic Results in last 30 days:  No results found.  Assessment/Plan  Migraine Chronic migraines with recent exacerbation, presenting with severe headache radiating from the front to the back of the head, neck, and shoulders, accompanied by nausea, lightheadedness, and photophobia. - Contact neurologist to expedite monthly injection for migraine management. - Continue Nurtec as needed for migraine relief. - Continue Zofran  for nausea as needed.  Hypertension Hypertension with recent elevated readings: 155/101, 150/99, and 155/100, despite current labetalol  regimen. - Start amlodipine  5 mg daily and titrate up to 10 mg if needed after two weeks based on blood pressure response. - Monitor blood pressure at home twice daily for two weeks. - Follow up in two weeks to assess blood pressure control. - Practice deep breathing exercises to help manage blood pressure. - Continue labetalol  twice daily.  Hypothyroidism Well-managed on levothyroxine  125 mcg daily with no changes in symptoms. - Continue levothyroxine  125 mcg daily.   Family/ staff Communication: Reviewed plan of care with patient verbalized understanding   Labs/tests ordered: None   Next Appointment: Return if  symptoms worsen or fail to improve.  Total time: 20 minutes. Greater than 50% of total time spent doing patient education regarding Migraine,HTN,Hypothyroidism,health maintenance including symptom/medication management.   Roxan JAYSON Plough, NP

## 2024-02-01 ENCOUNTER — Encounter: Payer: Self-pay | Admitting: Nurse Practitioner

## 2024-02-01 ENCOUNTER — Ambulatory Visit: Payer: Self-pay | Admitting: Nurse Practitioner

## 2024-02-01 VITALS — BP 118/72 | HR 96 | Temp 98.1°F | Ht 66.0 in | Wt 207.0 lb

## 2024-02-01 DIAGNOSIS — G43109 Migraine with aura, not intractable, without status migrainosus: Secondary | ICD-10-CM | POA: Diagnosis not present

## 2024-02-01 DIAGNOSIS — I10 Essential (primary) hypertension: Secondary | ICD-10-CM

## 2024-02-01 MED ORDER — AMLODIPINE BESYLATE 5 MG PO TABS: 5.0000 mg | ORAL_TABLET | Freq: Every day | ORAL | 1 refills | Status: AC

## 2024-02-01 NOTE — Progress Notes (Signed)
 Careteam: Patient Care Team: Mariah Harlene POUR, NP as PCP - General (Geriatric Medicine) Skeet Juliene SAUNDERS, DO as Consulting Physician (Neurology)  PLACE OF SERVICE:  [facility]  Advanced Directive information     No Known Allergies  Chief Complaint  Patient presents with   Medical Management of Chronic Issues    2 week follow up regarding bp    HPI:  Discussed the use of AI scribe software for clinical note transcription with the patient, who gave verbal consent to proceed.  History of Present Illness Mariah Rocha is a 38 year old female with hypertension who presents for a two-week follow-up on her blood pressure management.  She has been experiencing elevated blood pressure readings, which have shown improvement with her current medication regimen. She is on amlodipine  5 mg daily and labetalol  200 mg twice a day. Recent home blood pressure readings include 130/85 mmHg last week, 110/70 mmHg yesterday, and 137/83 mmHg the day before. Today, her blood pressure is 118/72 mmHg.  She experiences headaches, lightheadedness, and dizziness, which have improved slightly. She did not take Nurtec for her headache as it was not severe and attributes it to hunger, which improved after eating. She usually takes Nurtec when she experiences neck and shoulder pain, and it sometimes helps if taken early.  She has been making dietary changes, cutting back on fast food, and cooking more at home to reduce sodium intake. She does not consider herself a 'salt person'.  No chest pain, shortness of breath, or swelling.    Review of Systems:  Review of Systems  Constitutional:  Negative for chills, fever and weight loss.  HENT:  Negative for tinnitus.   Respiratory:  Negative for cough, sputum production and shortness of breath.   Cardiovascular:  Negative for chest pain, palpitations and leg swelling.  Gastrointestinal:  Negative for abdominal pain, constipation, diarrhea and heartburn.   Genitourinary:  Negative for dysuria, frequency and urgency.  Musculoskeletal:  Negative for back pain, falls, joint pain and myalgias.  Skin: Negative.   Neurological:  Positive for headaches. Negative for dizziness.  Psychiatric/Behavioral:  Negative for depression and memory loss. The patient does not have insomnia.     Past Medical History:  Diagnosis Date   Allergic rhinitis    Hx of migraines    Lower back pain    Thyroid  disease    Past Surgical History:  Procedure Laterality Date   HERNIA REPAIR  1989    Dr.Kathleen Duwaine    Social History:   reports that she quit smoking about 4 years ago. Her smoking use included cigarettes. She started smoking about 6 years ago. She has never used smokeless tobacco. She reports that she does not currently use alcohol. She reports that she does not currently use drugs after having used the following drugs: Marijuana.  Family History  Problem Relation Age of Onset   Hypertension Mother    Multiple sclerosis Maternal Aunt    Breast cancer Maternal Aunt    Dementia Maternal Grandmother    Parkinsonism Maternal Grandfather     Medications: Patient's Medications  New Prescriptions   No medications on file  Previous Medications   AMLODIPINE  (NORVASC ) 5 MG TABLET    Take 1 tablet (5 mg total) by mouth daily.   GALCANEZUMAB -GNLM (EMGALITY ) 120 MG/ML SOAJ    Inject 120 mg into the skin every 28 (twenty-eight) days.   LABETALOL  (NORMODYNE ) 200 MG TABLET    Take 1 tablet (200 mg total) by  mouth 2 (two) times daily.   LEVOTHYROXINE  (SYNTHROID ) 125 MCG TABLET    take HALF a tablet on Sundays from now on and continue to take 1 tablet Monday through Saturday   ONDANSETRON  (ZOFRAN -ODT) 4 MG DISINTEGRATING TABLET    Take 1 tablet (4 mg total) by mouth every 8 (eight) hours as needed for nausea or vomiting.   RIMEGEPANT SULFATE (NURTEC) 75 MG TBDP    Take 1 tablet (75 mg total) by mouth daily as needed (migraine).  Modified Medications   No  medications on file  Discontinued Medications   No medications on file    Physical Exam:  Vitals:   02/01/24 1353  BP: 118/72  Pulse: 96  Temp: 98.1 F (36.7 C)  SpO2: 98%  Weight: 207 lb (93.9 kg)  Height: 5' 6 (1.676 m)   Body mass index is 33.41 kg/m. Wt Readings from Last 3 Encounters:  02/01/24 207 lb (93.9 kg)  01/14/24 205 lb 3.2 oz (93.1 kg)  12/22/23 206 lb (93.4 kg)    Physical Exam Constitutional:      General: She is not in acute distress.    Appearance: She is well-developed. She is not diaphoretic.  HENT:     Head: Normocephalic and atraumatic.     Mouth/Throat:     Pharynx: No oropharyngeal exudate.  Eyes:     Conjunctiva/sclera: Conjunctivae normal.     Pupils: Pupils are equal, round, and reactive to light.  Cardiovascular:     Rate and Rhythm: Normal rate and regular rhythm.     Heart sounds: Normal heart sounds.  Pulmonary:     Effort: Pulmonary effort is normal.     Breath sounds: Normal breath sounds.  Abdominal:     General: Bowel sounds are normal.     Palpations: Abdomen is soft.  Musculoskeletal:     Cervical back: Normal range of motion and neck supple.     Right lower leg: No edema.     Left lower leg: No edema.  Skin:    General: Skin is warm and dry.  Neurological:     Mental Status: She is alert.  Psychiatric:        Mood and Affect: Mood normal.     Labs reviewed: Basic Metabolic Panel: Recent Labs    07/02/23 0837 12/07/23 1419  NA  --  140  K  --  3.9  CL  --  105  CO2  --  28  GLUCOSE  --  81  BUN  --  11  CREATININE  --  0.88  CALCIUM  --  9.1  TSH 3.50  --    Liver Function Tests: Recent Labs    12/07/23 1419  AST 20  ALT 19  BILITOT 0.4  PROT 7.3   No results for input(s): LIPASE, AMYLASE in the last 8760 hours. No results for input(s): AMMONIA in the last 8760 hours. CBC: Recent Labs    12/07/23 1419  WBC 5.3  NEUTROABS 2,634  HGB 11.4*  HCT 35.0  MCV 89.7  PLT 324   Lipid  Panel: Recent Labs    12/07/23 1419  CHOL 194  HDL 56  LDLCALC 111*  TRIG 154*  CHOLHDL 3.5   TSH: Recent Labs    07/02/23 0837  TSH 3.50   A1C: No results found for: HGBA1C   Assessment/Plan  Assessment & Plan Hypertension Blood pressure improving with amlodipine  and labetalol . No amlodipine -related edema. Dietary changes in progress. - Continue amlodipine  5  mg daily. - Continue labetalol  200 mg twice daily. - Monitor blood pressure regularly. - Implement dietary modifications to reduce sodium intake. - Encourage weight loss and exercise.   Migraine Intermittent migraines with lightheadedness. - Continue Nurtec for abortive treatment. - Advised taking Nurtec at earliest onset of symptoms.    To keep follow up as scheduled, sooner if needed  Greer Wainright K. Mariah BODILY [facility] [phone]

## 2024-06-10 ENCOUNTER — Ambulatory Visit: Payer: Self-pay | Admitting: Nurse Practitioner

## 2024-07-20 ENCOUNTER — Ambulatory Visit: Admitting: Neurology
